# Patient Record
Sex: Male | Born: 1995 | Race: White | Hispanic: No | Marital: Single | Smoking: Current every day smoker
Health system: Southern US, Community
[De-identification: ages and names within clinical notes are randomized; demographics above are authoritative.]

## PROBLEM LIST (undated history)

## (undated) DIAGNOSIS — F909 Attention-deficit hyperactivity disorder, unspecified type: Secondary | ICD-10-CM

## (undated) DIAGNOSIS — F419 Anxiety disorder, unspecified: Secondary | ICD-10-CM

## (undated) DIAGNOSIS — K4090 Unilateral inguinal hernia, without obstruction or gangrene, not specified as recurrent: Secondary | ICD-10-CM

## (undated) DIAGNOSIS — F32A Depression, unspecified: Secondary | ICD-10-CM

## (undated) DIAGNOSIS — F319 Bipolar disorder, unspecified: Secondary | ICD-10-CM

## (undated) DIAGNOSIS — F329 Major depressive disorder, single episode, unspecified: Secondary | ICD-10-CM

## (undated) HISTORY — DX: Major depressive disorder, single episode, unspecified: F32.9

## (undated) HISTORY — DX: Anxiety disorder, unspecified: F41.9

## (undated) HISTORY — DX: Depression, unspecified: F32.A

## (undated) HISTORY — DX: Attention-deficit hyperactivity disorder, unspecified type: F90.9

## (undated) HISTORY — DX: Bipolar disorder, unspecified: F31.9

---
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
## 2017-01-22 DIAGNOSIS — R569 Unspecified convulsions: Secondary | ICD-10-CM | POA: Insufficient documentation

## 2017-01-31 DIAGNOSIS — R569 Unspecified convulsions: Secondary | ICD-10-CM | POA: Insufficient documentation

## 2017-03-08 ENCOUNTER — Other Ambulatory Visit: Payer: Self-pay

## 2017-03-08 ENCOUNTER — Ambulatory Visit: Payer: 59 | Admitting: Psychiatry

## 2017-03-08 ENCOUNTER — Encounter: Payer: Self-pay | Admitting: Psychiatry

## 2017-03-08 VITALS — BP 111/69 | HR 60 | Temp 98.3°F | Wt 126.8 lb

## 2017-03-08 DIAGNOSIS — F1221 Cannabis dependence, in remission: Secondary | ICD-10-CM

## 2017-03-08 DIAGNOSIS — F3170 Bipolar disorder, currently in remission, most recent episode unspecified: Secondary | ICD-10-CM

## 2017-03-08 DIAGNOSIS — F1421 Cocaine dependence, in remission: Secondary | ICD-10-CM | POA: Diagnosis not present

## 2017-03-08 NOTE — Progress Notes (Signed)
Psychiatric progress note  Patient Identification: Charles GauzeShawn Austin Burke MRN:  [id number] Date of Evaluation:  03/08/2017 Referral Source: Dr.Puchilowska Chief Complaint:  Increased depression and anxiety Chief Complaint    Follow-up; Medication Refill     Visit Diagnosis:    ICD-10-CM   1. Bipolar disorder in partial remission, most recent episode unspecified type (HCC) F31.70   2. Cocaine dependence in early, early partial, sustained full, or sustained partial remission (HCC) F14.21   3. Cannabis dependence in early, early partial, sustained full, or sustained partial remission (HCC) F12.21     History of Present Illness:  Patient patient is a 22 year old Caucasian male who presents today for follow up Of bipolar disorder, cocaine dependence in early remission, cannabis dependence in early remission. Patient reports today that he has been feeling more depressed and anxious lately. States that he is quite stressed at work. 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  Reports fair sleep and appetite. Denies use of any marijuana or cocaine since last visit. Overall he has been quite down sober per patient.  States that he has not had any more seizure like episodes. Continues to stay with his grandmother. Compliant with his medications.   Past Psychiatric History: Most recently admitted to inpatient psychiatric services  Previous Psychotropic Medications: No   Substance Abuse History in the last 12 months:  Yes.    Consequences of Substance Abuse:   Past Medical History:  Past Medical History:  Diagnosis Date  . ADHD (attention deficit hyperactivity disorder)   . Anxiety   . Bipolar disorder (HCC)   . Depression    History reviewed. No pertinent surgical history.  Family Psychiatric History: see below  Family History:  Family History  Problem Relation Age of Onset  . Anxiety disorder Mother   . Depression Mother     Social History:   Social  History   Socioeconomic History  . Marital status: Single    Spouse name: None  . Number of children: None  . Years of education: None  . Highest education level: None  Social Needs  . Financial resource strain: None  . Food insecurity - worry: None  . Food insecurity - inability: None  . Transportation needs - medical: None  . Transportation needs - non-medical: None  Occupational History  . None  Tobacco Use  . Smoking status: Current Some Day Smoker    Packs/day: 1.00    Types: Cigarettes  . Smokeless tobacco: Never Used  Substance and Sexual Activity  . Alcohol use: No  . Drug use: Yes    Types: Cocaine, Marijuana    Comment: last used 2 weeks ago  . Sexual activity: Yes    Partners: Female    Birth control/protection: Condom  Other Topics Concern  . None  Social History Narrative  . None    Additional Social History:   Allergies:   Allergies  Allergen Reactions  . Bee Venom     Metabolic Disorder Labs: Lab Results  Component Value Date   HGBA1C 5.6 08/16/2016   MPG 114 08/16/2016   No results found for: PROLACTIN Lab Results  Component Value Date   CHOL 129 08/16/2016   TRIG 63 08/16/2016   HDL 49 08/16/2016   CHOLHDL 2.6 08/16/2016   VLDL 13 08/16/2016   LDLCALC 67 08/16/2016     Current Medications: Current Outpatient Medications  Medication Sig Dispense Refill  . ARIPiprazole (ABILIFY) 5 MG tablet Take 1 tablet (5  mg total) by mouth daily. 90 tablet 0  . Fluticasone-Salmeterol (ADVAIR DISKUS) 100-50 MCG/DOSE AEPB Inhale into the lungs.    . fluvoxaMINE (LUVOX) 100 MG tablet Take 1 tablet (100 mg total) by mouth at bedtime. 90 tablet 0  . traZODone (DESYREL) 100 MG tablet Take 1 tablet (100 mg total) by mouth at bedtime as needed for sleep. 30 tablet 1   No current facility-administered medications for this visit.     Neurologic: Headache: No Seizure: No Paresthesias:No  Musculoskeletal: Strength & Muscle Tone: within normal  limits Gait & Station: normal Patient leans: N/A  Psychiatric Specialty Exam: ROS  Blood pressure 111/69, pulse 60, temperature 98.3 F (36.8 C), temperature source Oral, weight 57.5 kg (126 lb 12.8 oz).Body mass index is 20.47 kg/m.  General Appearance: Casual  Eye Contact:  Fair  Speech:  Clear and Coherent  Volume:  Normal  Mood:  depressed  Affect:  Appropriate  Thought Process:  Coherent  Orientation:  Full (Time, Place, and Person)  Thought Content:  Logical  Suicidal Thoughts:  No  Homicidal Thoughts:  No  Memory:  Immediate;   Fair Recent;   Fair Remote;   Fair  Judgement:  Fair  Insight:  Fair  Psychomotor Activity:  Normal  Concentration:  Concentration: Fair and Attention Span: Fair  Recall:  [employer] of Knowledge:Fair  Language: Fair  Akathisia:  No  Handed:  Right  AIMS (if indicated):  none  Assets:  Communication Skills Desire for Improvement Financial Resources/Insurance Housing Physical Health Resilience Social Support Vocational/Educational  ADL's:  Intact  Cognition: WNL  Sleep:  ok    Treatment Plan Summary:  Bipolar 2 disorder currently in early remission Increase Abilify to 10 mg to take by mouth once daily.  Generalized anxiety disorder Continue Luvox at 100 mg daily  Cocaine dependence in very early remission  Cannabis dependence in early remission Same as above  Insomnia Resolved   Return to clinic in 1 months time or call before if needed     [name], MD 2/28/201910:39 AM

## 2017-03-15 DIAGNOSIS — R4689 Other symptoms and signs involving appearance and behavior: Secondary | ICD-10-CM | POA: Insufficient documentation

## 2017-03-21 ENCOUNTER — Telehealth: Payer: Self-pay

## 2017-03-21 NOTE — Telephone Encounter (Signed)
pt mother called states pt was seen on 03-08-17 and that his medication was not sent [facility].  pt mom wanted to know if a 30 day supply could be sent to [facility] [address] and a 90 day supply sent to [facility].

## 2017-03-22 ENCOUNTER — Other Ambulatory Visit: Payer: Self-pay | Admitting: Psychiatry

## 2017-03-22 MED ORDER — FLUVOXAMINE MALEATE 100 MG PO TABS: 100.0000 mg | ORAL_TABLET | Freq: Every day | ORAL | 0 refills | Status: DC

## 2017-03-22 MED ORDER — ARIPIPRAZOLE 5 MG PO TABS: 5.0000 mg | ORAL_TABLET | Freq: Every day | ORAL | 0 refills | Status: DC

## 2017-03-22 MED ORDER — TRAZODONE HCL 100 MG PO TABS: 100.0000 mg | ORAL_TABLET | Freq: Every evening | ORAL | 1 refills | Status: DC | PRN

## 2017-03-22 NOTE — Telephone Encounter (Signed)
faxed and confirmed rx for [facility]  abilify 5mg  id [id number] order # [id number]  , trazodone 100mg  id [id number] order # [id number] , and fluvoxamine 100mg  id [id number] order # [id number]

## 2017-03-22 NOTE — Telephone Encounter (Signed)
[facility]

## 2017-04-05 ENCOUNTER — Ambulatory Visit: Payer: 59 | Admitting: Psychiatry

## 2017-04-05 ENCOUNTER — Other Ambulatory Visit: Payer: Self-pay

## 2017-04-05 ENCOUNTER — Encounter: Payer: Self-pay | Admitting: Psychiatry

## 2017-04-05 VITALS — BP 121/72 | HR 65 | Temp 97.5°F | Wt 128.8 lb

## 2017-04-05 DIAGNOSIS — F1221 Cannabis dependence, in remission: Secondary | ICD-10-CM | POA: Diagnosis not present

## 2017-04-05 DIAGNOSIS — F3170 Bipolar disorder, currently in remission, most recent episode unspecified: Secondary | ICD-10-CM

## 2017-04-05 DIAGNOSIS — F1421 Cocaine dependence, in remission: Secondary | ICD-10-CM

## 2017-04-05 MED ORDER — ARIPIPRAZOLE 10 MG PO TABS: 10.0000 mg | ORAL_TABLET | Freq: Every day | ORAL | 1 refills | Status: DC

## 2017-04-05 NOTE — Progress Notes (Signed)
Psychiatric progress note  Patient Identification: Charles Burke MRN:  [id number] Date of Evaluation:  04/05/2017 Referral Source: Dr.Puchilowska Chief Complaint:  Feeling the same Chief Complaint    Follow-up; Medication Refill     Visit Diagnosis:    ICD-10-CM   1. Bipolar disorder in partial remission, most recent episode unspecified type (HCC) F31.70   2. Cocaine dependence in early, early partial, sustained full, or sustained partial remission (HCC) F14.21   3. Cannabis dependence in early, early partial, sustained full, or sustained partial remission (HCC) F12.21     History of Present Illness:  Patient patient is a 22 year old Caucasian male who presents today for follow up Of bipolar disorder, cocaine dependence in early remission, cannabis dependence in early remission. Patient reports today that he has been feeling he same.  Patient not as talkative today. States that he stopped his job 2 days ago but would not say more about it. States that his sleep and appetite are okay. Denies using any substances. Patient appears irritable and on edge. Continues to stay with his grandmother. Compliant with his medications. Denies any suicidal thoughts.   Past Psychiatric History: Most recently admitted to inpatient psychiatric services  Previous Psychotropic Medications: No   Substance Abuse History in the last 12 months:  Yes.    Consequences of Substance Abuse:   Past Medical History:  Past Medical History:  Diagnosis Date  . ADHD (attention deficit hyperactivity disorder)   . Anxiety   . Bipolar disorder (HCC)   . Depression    History reviewed. No pertinent surgical history.  Family Psychiatric History: see below  Family History:  Family History  Problem Relation Age of Onset  . Anxiety disorder Mother   . Depression Mother     Social History:   Social History   Socioeconomic History  . Marital status: Single    Spouse name: Not on file  . Number of  children: Not on file  . Years of education: Not on file  . Highest education level: Not on file  Occupational History  . Not on file  Social Needs  . Financial resource strain: Not on file  . Food insecurity:    Worry: Not on file    Inability: Not on file  . Transportation needs:    Medical: Not on file    Non-medical: Not on file  Tobacco Use  . Smoking status: Current Some Day Smoker    Packs/day: 1.00    Types: Cigarettes  . Smokeless tobacco: Never Used  Substance and Sexual Activity  . Alcohol use: No  . Drug use: Yes    Types: Cocaine, Marijuana    Comment: last used 2 weeks ago  . Sexual activity: Yes    Partners: Female    Birth control/protection: Condom  Lifestyle  . Physical activity:    Days per week: Not on file    Minutes per session: Not on file  . Stress: Not on file  Relationships  . Social connections:    Talks on phone: Not on file    Gets together: Not on file    Attends religious service: Not on file    Active member of club or organization: Not on file    Attends meetings of clubs or organizations: Not on file    Relationship status: Not on file  Other Topics Concern  . Not on file  Social History Narrative  . Not on file    Additional Social History:   Allergies:  Allergies  Allergen Reactions  . Bee Venom     Metabolic Disorder Labs: Lab Results  Component Value Date   HGBA1C 5.6 08/16/2016   MPG 114 08/16/2016   No results found for: PROLACTIN Lab Results  Component Value Date   CHOL 129 08/16/2016   TRIG 63 08/16/2016   HDL 49 08/16/2016   CHOLHDL 2.6 08/16/2016   VLDL 13 08/16/2016   LDLCALC 67 08/16/2016     Current Medications: Current Outpatient Medications  Medication Sig Dispense Refill  . ARIPiprazole (ABILIFY) 5 MG tablet Take 1 tablet (5 mg total) by mouth daily. 90 tablet 0  . Fluticasone-Salmeterol (ADVAIR DISKUS) 100-50 MCG/DOSE AEPB Inhale into the lungs.    . fluvoxaMINE (LUVOX) 100 MG tablet Take 1  tablet (100 mg total) by mouth at bedtime. 90 tablet 0  . traZODone (DESYREL) 100 MG tablet Take 1 tablet (100 mg total) by mouth at bedtime as needed for sleep. 30 tablet 1   No current facility-administered medications for this visit.     Neurologic: Headache: No Seizure: No Paresthesias:No  Musculoskeletal: Strength & Muscle Tone: within normal limits Gait & Station: normal Patient leans: N/A  Psychiatric Specialty Exam: ROS  Blood pressure 121/72, pulse 65, temperature (!) 97.5 F (36.4 C), temperature source Oral, weight 58.4 kg (128 lb 12.8 oz).Body mass index is 20.79 kg/m.  General Appearance: Casual  Eye Contact:  Fair  Speech:  Clear and Coherent  Volume:  Normal  Mood:  depressed  Affect:   irritable  Thought Process:  Coherent  Orientation:  Full (Time, Place, and Person)  Thought Content:  Logical  Suicidal Thoughts:  No  Homicidal Thoughts:  No  Memory:  Immediate;   Fair Recent;   Fair Remote;   Fair  Judgement:  Fair  Insight:  Fair  Psychomotor Activity:  Normal  Concentration:  Concentration: Fair and Attention Span: Fair  Recall:  [employer] of Knowledge:Fair  Language: Fair  Akathisia:  No  Handed:  Right  AIMS (if indicated):  none  Assets:  Communication Skills Desire for Improvement Financial Resources/Insurance Housing Physical Health Resilience Social Support Vocational/Educational  ADL's:  Intact  Cognition: WNL  Sleep:  ok    Treatment Plan Summary:  Bipolar 2 disorder currently in early remission Continue Abilify to 10 mg to take by mouth once daily.  Generalized anxiety disorder Continue Luvox at 100 mg daily  Cocaine dependence in very early remission  Cannabis dependence in early remission Same as above Obtain urine toxicology and blood toxicology analysis prior to next visit  Recommend that patient see a therapist but he is not interested at this time.  Insomnia Resolved   Return to clinic in 1 months time or  call before if needed      [name], MD 3/28/201910:38 AM

## 2017-05-03 ENCOUNTER — Ambulatory Visit: Payer: 59 | Admitting: Psychiatry

## 2017-05-29 ENCOUNTER — Other Ambulatory Visit: Payer: Self-pay

## 2017-05-29 ENCOUNTER — Encounter: Payer: Self-pay | Admitting: Psychiatry

## 2017-05-29 ENCOUNTER — Ambulatory Visit: Payer: 59 | Admitting: Psychiatry

## 2017-05-29 VITALS — BP 120/71 | HR 81 | Temp 97.8°F | Wt 128.4 lb

## 2017-05-29 DIAGNOSIS — F1421 Cocaine dependence, in remission: Secondary | ICD-10-CM

## 2017-05-29 DIAGNOSIS — F3170 Bipolar disorder, currently in remission, most recent episode unspecified: Secondary | ICD-10-CM | POA: Diagnosis not present

## 2017-05-29 DIAGNOSIS — F1221 Cannabis dependence, in remission: Secondary | ICD-10-CM | POA: Diagnosis not present

## 2017-05-29 MED ORDER — FLUVOXAMINE MALEATE 100 MG PO TABS: 100.0000 mg | ORAL_TABLET | Freq: Every day | ORAL | 2 refills | Status: DC

## 2017-05-29 MED ORDER — ARIPIPRAZOLE 10 MG PO TABS: 10.0000 mg | ORAL_TABLET | Freq: Every day | ORAL | 2 refills | Status: DC

## 2017-05-29 NOTE — Progress Notes (Signed)
Psychiatric progress note  Patient Identification: Charles Burke MRN:  [id number] Date of Evaluation:  05/29/2017 Referral Source: Dr.Puchilowska Chief Complaint:  Feeling the same Chief Complaint    Follow-up; Medication Refill     Visit Diagnosis:    ICD-10-CM   1. Bipolar disorder in partial remission, most recent episode unspecified type (HCC) F31.70   2. Cocaine dependence in early, early partial, sustained full, or sustained partial remission (HCC) F14.21   3. Cannabis dependence in early, early partial, sustained full, or sustained partial remission (HCC) F12.21     History of Present Illness:  Patient patient is a 22 year old Caucasian male who presents today for follow up Of bipolar disorder, cocaine dependence in early remission, cannabis dependence in early remission. Patient denies any issues. He appears somewhat sleepy and slow talking but denies using any substances. States he lost his lab slip from last time and did not obtain his urine or blood toxicology labs. Not working currently. States he is moving to [place] for the summer. Denies any suicidal thoughts.   Past Psychiatric History: Most recently admitted to inpatient psychiatric services  Previous Psychotropic Medications: No   Substance Abuse History in the last 12 months:  Yes.    Consequences of Substance Abuse:   Past Medical History:  Past Medical History:  Diagnosis Date  . ADHD (attention deficit hyperactivity disorder)   . Anxiety   . Bipolar disorder (HCC)   . Depression    History reviewed. No pertinent surgical history.  Family Psychiatric History: see below  Family History:  Family History  Problem Relation Age of Onset  . Anxiety disorder Mother   . Depression Mother     Social History:   Social History   Socioeconomic History  . Marital status: Single    Spouse name: Not on file  . Number of children: Not on file  . Years of education: Not on file  . Highest education  level: Not on file  Occupational History  . Not on file  Social Needs  . Financial resource strain: Not on file  . Food insecurity:    Worry: Not on file    Inability: Not on file  . Transportation needs:    Medical: Not on file    Non-medical: Not on file  Tobacco Use  . Smoking status: Current Some Day Smoker    Packs/day: 1.00    Types: Cigarettes  . Smokeless tobacco: Never Used  Substance and Sexual Activity  . Alcohol use: No  . Drug use: Yes    Types: Cocaine, Marijuana    Comment: last used 2 weeks ago  . Sexual activity: Yes    Partners: Female    Birth control/protection: Condom  Lifestyle  . Physical activity:    Days per week: Not on file    Minutes per session: Not on file  . Stress: Not on file  Relationships  . Social connections:    Talks on phone: Not on file    Gets together: Not on file    Attends religious service: Not on file    Active member of club or organization: Not on file    Attends meetings of clubs or organizations: Not on file    Relationship status: Not on file  Other Topics Concern  . Not on file  Social History Narrative  . Not on file    Additional Social History:   Allergies:   Allergies  Allergen Reactions  . Bee Venom  Metabolic Disorder Labs: Lab Results  Component Value Date   HGBA1C 5.6 08/16/2016   MPG 114 08/16/2016   No results found for: PROLACTIN Lab Results  Component Value Date   CHOL 129 08/16/2016   TRIG 63 08/16/2016   HDL 49 08/16/2016   CHOLHDL 2.6 08/16/2016   VLDL 13 08/16/2016   LDLCALC 67 08/16/2016     Current Medications: Current Outpatient Medications  Medication Sig Dispense Refill  . ARIPiprazole (ABILIFY) 10 MG tablet Take 1 tablet (10 mg total) by mouth daily. 30 tablet 1  . Fluticasone-Salmeterol (ADVAIR DISKUS) 100-50 MCG/DOSE AEPB Inhale into the lungs.    . fluvoxaMINE (LUVOX) 100 MG tablet Take 1 tablet (100 mg total) by mouth at bedtime. 90 tablet 0  . traZODone  (DESYREL) 100 MG tablet Take 1 tablet (100 mg total) by mouth at bedtime as needed for sleep. 30 tablet 1   No current facility-administered medications for this visit.     Neurologic: Headache: No Seizure: No Paresthesias:No  Musculoskeletal: Strength & Muscle Tone: within normal limits Gait & Station: normal Patient leans: N/A  Psychiatric Specialty Exam: ROS  Blood pressure 120/71, pulse 81, temperature 97.8 F (36.6 C), temperature source Oral, weight 58.2 kg (128 lb 6.4 oz).Body mass index is 20.72 kg/m.  General Appearance: Casual  Eye Contact:  Fair  Speech:  Clear and Coherent, slow  Volume:  Normal  Mood: better  Affect:  Sleepy,   Thought Process:  Coherent  Orientation:  Full (Time, Place, and Person)  Thought Content:  Logical  Suicidal Thoughts:  No  Homicidal Thoughts:  No  Memory:  Immediate;   Fair Recent;   Fair Remote;   Fair  Judgement:  Fair  Insight:  Fair  Psychomotor Activity:  Normal  Concentration:  Concentration: Fair and Attention Span: Fair  Recall:  [employer] of Knowledge:Fair  Language: Fair  Akathisia:  No  Handed:  Right  AIMS (if indicated):  none  Assets:  Communication Skills Desire for Improvement Financial Resources/Insurance Housing Physical Health Resilience Social Support Vocational/Educational  ADL's:  Intact  Cognition: WNL  Sleep:  ok    Treatment Plan Summary:  Bipolar 2 disorder currently in early remission Continue Abilify to 10 mg to take by mouth once daily.  Generalized anxiety disorder Continue Luvox at 100 mg daily  Cocaine dependence in very early remission  Cannabis dependence in early remission Same as above Obtain urine toxicology and blood toxicology analysis prior to next visit, lab slip given.  Discussed following up with the substance abuse program in [place], patient not interested.   Insomnia Resolved   Return to clinic in 3 months time or call before if  needed      [name], MD 5/21/201910:38 AM

## 2017-06-11 ENCOUNTER — Telehealth: Payer: Self-pay

## 2017-06-11 NOTE — Telephone Encounter (Signed)
mother called back states that they need a 90 day supply instead of 30 day supply.

## 2017-06-11 NOTE — Telephone Encounter (Signed)
pt lost his rx that you gave him for his medication .  mother called and asked if the rx can just be sent to [facility] instead of printed out.

## 2017-06-12 ENCOUNTER — Other Ambulatory Visit: Payer: Self-pay | Admitting: Psychiatry

## 2017-06-12 ENCOUNTER — Encounter: Payer: Self-pay | Admitting: Psychiatry

## 2017-06-12 MED ORDER — ARIPIPRAZOLE 10 MG PO TABS
10.0000 mg | ORAL_TABLET | Freq: Every day | ORAL | 1 refills | Status: AC
Start: 2017-06-12 — End: ?

## 2017-06-12 MED ORDER — FLUVOXAMINE MALEATE 100 MG PO TABS: 100.0000 mg | ORAL_TABLET | Freq: Every day | ORAL | 1 refills | Status: AC

## 2017-06-12 MED ORDER — TRAZODONE HCL 100 MG PO TABS: 100.0000 mg | ORAL_TABLET | Freq: Every evening | ORAL | 1 refills | Status: AC | PRN

## 2017-06-12 NOTE — Telephone Encounter (Signed)
rx for abilify , luvox, desyrel was faxed and confirmed  to [facility]. mother notified that rx were sent.

## 2017-06-12 NOTE — Telephone Encounter (Signed)
Okay. prescriptions printed

## 2017-12-08 ENCOUNTER — Emergency Department
Admission: EM | Admit: 2017-12-08 | Discharge: 2017-12-09 | Disposition: A | Discharge status: Home / Self Care | Payer: Managed Care, Other (non HMO) | Attending: Emergency Medicine | Admitting: Emergency Medicine

## 2017-12-08 ENCOUNTER — Encounter: Payer: Self-pay | Admitting: Emergency Medicine

## 2017-12-08 ENCOUNTER — Other Ambulatory Visit: Payer: Self-pay

## 2017-12-08 DIAGNOSIS — F1210 Cannabis abuse, uncomplicated: Secondary | ICD-10-CM | POA: Insufficient documentation

## 2017-12-08 DIAGNOSIS — F39 Unspecified mood [affective] disorder: Secondary | ICD-10-CM

## 2017-12-08 DIAGNOSIS — F17210 Nicotine dependence, cigarettes, uncomplicated: Secondary | ICD-10-CM | POA: Insufficient documentation

## 2017-12-08 DIAGNOSIS — R4689 Other symptoms and signs involving appearance and behavior: Secondary | ICD-10-CM | POA: Diagnosis present

## 2017-12-08 DIAGNOSIS — J45909 Unspecified asthma, uncomplicated: Secondary | ICD-10-CM | POA: Diagnosis not present

## 2017-12-08 DIAGNOSIS — F319 Bipolar disorder, unspecified: Secondary | ICD-10-CM

## 2017-12-08 DIAGNOSIS — F1410 Cocaine abuse, uncomplicated: Secondary | ICD-10-CM | POA: Diagnosis not present

## 2017-12-08 DIAGNOSIS — F909 Attention-deficit hyperactivity disorder, unspecified type: Secondary | ICD-10-CM | POA: Insufficient documentation

## 2017-12-08 DIAGNOSIS — Z79899 Other long term (current) drug therapy: Secondary | ICD-10-CM | POA: Insufficient documentation

## 2017-12-08 MED ORDER — ZIPRASIDONE MESYLATE 20 MG IM SOLR
10.0000 mg | Freq: Once | INTRAMUSCULAR | Status: DC
  Filled 2017-12-08: qty 20

## 2017-12-08 NOTE — ED Provider Notes (Signed)
[facility] Emergency Department Provider Note   ____________________________________________   First MD Initiated Contact with Patient 12/08/17 2356     (approximate)  I have reviewed the triage vital signs and the nursing notes.   HISTORY  Chief Complaint Aggressive Behavior  Level V caveat: Limited by aggressive behavior  HPI Charles Burke is a 22 y.o. male brought to the ED under IVC for aggression and SI.  Patient arrives combative, refusing blood draw or dressing out.  Unable to obtain rest of interview and history secondary to combative behavior.   Past Medical History:  Diagnosis Date  . ADHD (attention deficit hyperactivity disorder)   . Anxiety   . Bipolar disorder (HCC)   . Depression     Patient Active Problem List   Diagnosis Date Noted  . Spell of abnormal behavior 03/15/2017  . Seizure-like activity (HCC) 01/31/2017  . Seizure (HCC) 01/22/2017  . Asthma 08/16/2016  . Bipolar I disorder, current or most recent episode depressed, with psychotic features (HCC) 08/15/2016  . Suicidal ideation 08/15/2016  . Cocaine use disorder, moderate, dependence (HCC) 08/15/2016  . Cannabis use disorder, moderate, dependence (HCC) 08/15/2016    History reviewed. No pertinent surgical history.  Prior to Admission medications   Medication Sig Start Date End Date Taking? Authorizing Provider  ARIPiprazole (ABILIFY) 10 MG tablet Take 1 tablet (10 mg total) by mouth daily. 06/12/17   [name], MD  Fluticasone-Salmeterol (ADVAIR DISKUS) 100-50 MCG/DOSE AEPB Inhale into the lungs.    [provider]  fluvoxaMINE (LUVOX) 100 MG tablet Take 1 tablet (100 mg total) by mouth at bedtime. 06/12/17   [name], MD  traZODone (DESYREL) 100 MG tablet Take 1 tablet (100 mg total) by mouth at bedtime as needed for sleep. 06/12/17   [name], MD    Allergies Bee venom  Family History  Problem Relation Age of Onset  . Anxiety  disorder Mother   . Depression Mother     Social History Social History   Tobacco Use  . Smoking status: Current Some Day Smoker    Packs/day: 1.00    Types: Cigarettes  . Smokeless tobacco: Never Used  Substance Use Topics  . Alcohol use: No  . Drug use: Yes    Types: Cocaine, Marijuana    Comment: last used 2 weeks ago    Review of Systems  Constitutional: No fever/chills Eyes: No visual changes. ENT: No sore throat. Cardiovascular: Denies chest pain. Respiratory: Denies shortness of breath. Gastrointestinal: No abdominal pain.  No nausea, no vomiting.  No diarrhea.  No constipation. Genitourinary: Negative for dysuria. Musculoskeletal: Negative for back pain. Skin: Negative for rash. Neurological: Negative for headaches, focal weakness or numbness. Psychiatric:Positive for SI.  ____________________________________________   PHYSICAL EXAM:  VITAL SIGNS: ED Triage Vitals  Enc Vitals Group     BP 12/08/17 2231 97/67     Pulse Rate 12/08/17 2231 68     Resp 12/08/17 2231 18     Temp 12/08/17 2231 98.1 F (36.7 C)     Temp Source 12/08/17 2231 Oral     SpO2 12/08/17 2231 97 %     Weight 12/08/17 2232 125 lb (56.7 kg)     Height 12/08/17 2232 5\' 5"  (1.651 m)     Head Circumference --      Peak Flow --      Pain Score 12/08/17 2240 0     Pain Loc --      Pain Edu? --  Excl. in GC? --     Constitutional: Alert and oriented.  Disheveled appearing and in mild acute distress. Eyes: Conjunctivae are normal. PERRL. EOMI. Head: Atraumatic. Nose: No congestion/rhinnorhea. Mouth/Throat: Mucous membranes are moist.  Oropharynx non-erythematous. Neck: No stridor.   Cardiovascular: Normal rate, regular rhythm. Grossly normal heart sounds.  Good peripheral circulation. Respiratory: Normal respiratory effort.  No retractions. Lungs CTAB. Gastrointestinal: Soft and nontender. No distention. No abdominal bruits. No CVA tenderness. Musculoskeletal: No lower  extremity tenderness nor edema.  No joint effusions. Neurologic:  Normal speech and language. No gross focal neurologic deficits are appreciated. No gait instability. Skin:  Skin is warm, dry and intact. No rash noted. Psychiatric: Mood and affect are combative. Speech and behavior are normal.  ____________________________________________   LABS (all labs ordered are listed, but only abnormal results are displayed)  Labs Reviewed  ACETAMINOPHEN LEVEL - Abnormal; Notable for the following components:      Result Value   Acetaminophen (Tylenol), Serum <10 (*)    All other components within normal limits  CBC WITH DIFFERENTIAL/PLATELET  COMPREHENSIVE METABOLIC PANEL  ETHANOL  SALICYLATE LEVEL  URINE DRUG SCREEN, QUALITATIVE ([facility] ONLY)   ____________________________________________  EKG  None ____________________________________________  RADIOLOGY  ED MD interpretation: None  Official radiology report(s): No results found.  ____________________________________________   PROCEDURES  Procedure(s) performed: None  Procedures  Critical Care performed: No  ____________________________________________   INITIAL IMPRESSION / ASSESSMENT AND PLAN / ED COURSE  As part of my medical decision making, I reviewed the following data within the electronic MEDICAL RECORD NUMBER Nursing notes reviewed and incorporated, Labs reviewed, Old chart reviewed, A consult was requested and obtained from this/these consultant(s) Psychiatry and Notes from prior ED visits   22 year old male with bipolar disorder brought in under IVC for suicidal ideation.  Patient is combative upon arrival to the treatment room.  He was ultimately able to be verbally redirected by the nurse and did not require IM calming agent for now.  Offered Ativan for calming but patient declines.  Clinical Course as of Dec 09 725  Wynelle LinkSun Dec 09, 2017  [id number] [facility] Waldron SessionGerz who has rescinded  his IVC.  Does not meet criteria for IVC or inpatient admission.  Recommends referral for outpatient treatment.  Strict return precautions given.  Patient verbalizes understanding and agrees with plan of care.   [JS]    Clinical Course User Index [JS] Irean HongSung, Clairissa Valvano J, MD     ____________________________________________   FINAL CLINICAL IMPRESSION(S) / ED DIAGNOSES  Final diagnoses:  Bipolar 1 disorder (HCC)  Mood disorder [facility](HCC)     ED Discharge Orders    None       Note:  This document was prepared using Dragon voice recognition software and may include unintentional dictation errors.    Irean HongSung, Oliver Neuwirth J, MD 12/09/17 [phone]

## 2017-12-08 NOTE — ED Triage Notes (Signed)
Pt arrives with [place] [employer] IVC due to being aggressive at home and making SI statements. Pt refuses to have his blood drawn at this time or give urine of dress out. Pt stating that "Grandma's a bitch", and that this RN does not "understand, I'm telling you, you ain't getting my blood".

## 2017-12-08 NOTE — ED Notes (Signed)
At this time pt is refusing to let this writer get blood work

## 2017-12-09 LAB — CBC WITH DIFFERENTIAL/PLATELET
ABS IMMATURE GRANULOCYTES: 0.06 10*3/uL (ref 0.00–0.07)
Basophils Absolute: 0 10*3/uL (ref 0.0–0.1)
Basophils Relative: 0 %
Eosinophils Absolute: 0.2 10*3/uL (ref 0.0–0.5)
Eosinophils Relative: 2 %
HCT: 44.7 % (ref 39.0–52.0)
HEMOGLOBIN: 15.1 g/dL (ref 13.0–17.0)
IMMATURE GRANULOCYTES: 1 %
LYMPHS PCT: 35 %
Lymphs Abs: 3.2 10*3/uL (ref 0.7–4.0)
MCH: 30.3 pg (ref 26.0–34.0)
MCHC: 33.8 g/dL (ref 30.0–36.0)
MCV: 89.6 fL (ref 80.0–100.0)
MONO ABS: 0.7 10*3/uL (ref 0.1–1.0)
MONOS PCT: 7 %
NEUTROS ABS: 5 10*3/uL (ref 1.7–7.7)
NEUTROS PCT: 55 %
Platelets: 228 10*3/uL (ref 150–400)
RBC: 4.99 MIL/uL (ref 4.22–5.81)
RDW: 12 % (ref 11.5–15.5)
WBC: 9.1 10*3/uL (ref 4.0–10.5)
nRBC: 0 % (ref 0.0–0.2)

## 2017-12-09 LAB — COMPREHENSIVE METABOLIC PANEL
ALK PHOS: 38 U/L (ref 38–126)
ALT: 17 U/L (ref 0–44)
AST: 21 U/L (ref 15–41)
Albumin: 4.2 g/dL (ref 3.5–5.0)
Anion gap: 7 (ref 5–15)
BUN: 15 mg/dL (ref 6–20)
CALCIUM: 9.1 mg/dL (ref 8.9–10.3)
CHLORIDE: 103 mmol/L (ref 98–111)
CO2: 30 mmol/L (ref 22–32)
CREATININE: 0.92 mg/dL (ref 0.61–1.24)
GFR calc non Af Amer: >60 mL/min (ref >60)
Glucose, Bld: 95 mg/dL (ref 70–99)
Potassium: 3.9 mmol/L (ref 3.5–5.1)
SODIUM: 140 mmol/L (ref 135–145)
Total Bilirubin: 0.7 mg/dL (ref 0.3–1.2)
Total Protein: 7 g/dL (ref 6.5–8.1)

## 2017-12-09 LAB — ACETAMINOPHEN LEVEL

## 2017-12-09 LAB — ETHANOL: Alcohol, Ethyl (B): <10 mg/dL (ref <10)

## 2017-12-09 LAB — SALICYLATE LEVEL: Salicylate Lvl: <7 mg/dL (ref 2.8–30.0)

## 2017-12-09 MED ORDER — NICOTINE 21 MG/24HR TD PT24
21.0000 mg | MEDICATED_PATCH | Freq: Once | TRANSDERMAL | Status: DC
  Administered 2017-12-09: 21 mg via TRANSDERMAL
  Filled 2017-12-09: qty 1

## 2017-12-09 NOTE — ED Notes (Signed)
Pt. Changed in room #20.  Pt. Has a pair of brown boots, pair of olive jean pants, plaid blue boxers, yellow T-shirt, grey pullover sweatshirt and brown long underwear.  Blood specimen also collected in room.

## 2017-12-09 NOTE — BH Assessment (Signed)
Assessment Note  Charles Burke is an 22 y.o. male. Who presents following involuntary commitment accompanied by the police department. Patient is well oriented and explained that he has a history of bipolar disorder. He reports one previous admission here at [facility] after which he was placed on psychiatric medication. He reports compliance up until approximately 7 months ago, when he ran out of medication and failed to schedule a follow-up medication management appointment. Patient denies any suicidal thoughts, plans, or intent to harm himself. He reports the one previous suicide attempt. Patient states that on today he began to have an argument with his grandmother, whom he lived with. He states "she didn't like me cursing at her so she called the police." Patient denies the use of any mood-altering substances. Patient is not manic, hyperverbal, aggressive when engaging with this write. His speech and thought process are liner, although his affect was flat. He appears to exhibit average insight and impulse control. Pt.has contracted for safety. Pt. denies the presence of any auditory or visual hallucinations at this time. Patient denies any other medical complaints.   Diagnosis: Bipolar Disorder   Past Medical History:  Past Medical History:  Diagnosis Date  . ADHD (attention deficit hyperactivity disorder)   . Anxiety   . Bipolar disorder (HCC)   . Depression     History reviewed. No pertinent surgical history.  Family History:  Family History  Problem Relation Age of Onset  . Anxiety disorder Mother   . Depression Mother     Social History:  reports that he has been smoking cigarettes. He has been smoking about 1.00 pack per day. He has never used smokeless tobacco. He reports that he has current or past drug history. Drugs: Cocaine and Marijuana. He reports that he does not drink alcohol.  Additional Social History:  Alcohol / Drug Use Pain Medications: Pt denies  abuse. Prescriptions: Pt denies abuse. Over the Counter: Pt denies abuse. History of alcohol / drug use?: No history of alcohol / drug abuse  CIWA: CIWA-Ar BP: 97/67 Pulse Rate: 68 COWS:    Allergies:  Allergies  Allergen Reactions  . Bee Venom     Home Medications:  (Not in a hospital admission)  OB/GYN Status:  No LMP for male patient.  General Assessment Data Location of Assessment: [facility] ED TTS Assessment: In system Is this a Tele or Face-to-Face Assessment?: Face-to-Face Is this an Initial Assessment or a Re-assessment for this encounter?: Initial Assessment Patient Accompanied by:: N/A Language Other than English: No Living Arrangements: Other (Comment)(With family ) What gender do you identify as?: Male Marital status: Single Pregnancy Status: No Living Arrangements: Other relatives Can pt return to current living arrangement?: Yes Admission Status: Involuntary Petitioner: Family member Is patient capable of signing voluntary admission?: No Referral Source: Self/Family/Friend Insurance type: U[employer]  Medical Screening Exam [facility]) Medical Exam completed: Yes  Crisis Care Plan Living Arrangements: Other relatives Legal Guardian: Other:(None) Name of Psychiatrist: none  Name of Therapist: none   Education Status Is patient currently in school?: No Is the patient employed, unemployed or receiving disability?: Unemployed  Risk to self with the past 6 months Suicidal Ideation: No Has patient been a risk to self within the past 6 months prior to admission? : No Suicidal Intent: No Has patient had any suicidal intent within the past 6 months prior to admission? : No Is patient at risk for suicide?: No, but patient needs Medical Clearance Suicidal Plan?: No Has patient  had any suicidal plan within the past 6 months prior to admission? : No Access to Means: No What has been your use of drugs/alcohol within the last 12 months?: none  Previous  Attempts/Gestures: Yes How many times?: 1 Other Self Harm Risks: none Triggers for Past Attempts: None known Intentional Self Injurious Behavior: None Family Suicide History: No Recent stressful life event(s): Other (Comment) Persecutory voices/beliefs?: No Depression: No Depression Symptoms: (pt deneid ) Substance abuse history and/or treatment for substance abuse?: No Suicide prevention information given to non-admitted patients: Not applicable  Risk to Others within the past 6 months Homicidal Ideation: No Does patient have any lifetime risk of violence toward others beyond the six months prior to admission? : No Thoughts of Harm to Others: No Current Homicidal Intent: No Current Homicidal Plan: No Access to Homicidal Means: No Identified Victim: no reported History of harm to others?: No Assessment of Violence: On admission Violent Behavior Description: verbal aggression  Does patient have access to weapons?: No Criminal Charges Pending?: No Does patient have a court date: No Is patient on probation?: No  Psychosis Hallucinations: None noted Delusions: None noted  Mental Status Report Appearance/Hygiene: In scrubs Eye Contact: Fair Motor Activity: Freedom of movement Speech: Pressured Level of Consciousness: Alert Mood: Suspicious Affect: Apprehensive Anxiety Level: None Thought Processes: Coherent Judgement: Partial Orientation: Time, Situation, Place, Person Obsessive Compulsive Thoughts/Behaviors: None  Cognitive Functioning Concentration: Good Memory: Remote Intact, Recent Intact Is patient IDD: No Impulse Control: Fair Appetite: Fair Have you had any weight changes? : No Change Sleep: No Change Total Hours of Sleep: 6 Vegetative Symptoms: None  ADLScreening [facility] Assessment Services) Patient's cognitive ability adequate to safely complete daily activities?: Yes Patient able to express need for assistance with ADLs?: Yes Independently performs  ADLs?: Yes (appropriate for developmental age)  Prior Inpatient Therapy Prior Inpatient Therapy: Yes Prior Therapy Dates: 2018 [facility] Reason for Treatment: SI  Prior Outpatient Therapy Prior Outpatient Therapy: No Does patient have an ACCT team?: No Does patient have Intensive In-House Services?  : No Does patient have [facility] services? : No Does patient have P4CC services?: No  ADL Screening (condition at time of admission) Patient's cognitive ability adequate to safely complete daily activities?: Yes Patient able to express need for assistance with ADLs?: Yes Independently performs ADLs?: Yes (appropriate for developmental age)       Abuse/Neglect Assessment (Assessment to be complete while patient is alone) Abuse/Neglect Assessment Can Be Completed: Yes Physical Abuse: Denies Verbal Abuse: Denies Sexual Abuse: Denies Exploitation of patient/patient's resources: Denies Values / Beliefs Cultural Requests During Hospitalization: None Spiritual Requests During Hospitalization: None Consults Spiritual Care Consult Needed: No Advance Directives (For Healthcare) Does Patient Have a Medical Advance Directive?: No Would patient like information on creating a medical advance directive?: No - Patient declined          Disposition:  Disposition Initial Assessment Completed for this Encounter: Yes Patient referred to: Other (Comment)([facility]  On Site Evaluation by:   Reviewed with Physician:    Asa SaunasShawanna N Hansen Carino 12/09/2017 4:21 AM

## 2017-12-09 NOTE — ED Notes (Signed)
TTS talking to patient. 

## 2017-12-09 NOTE — ED Notes (Signed)
SOC called back recommend discharge with outpatient resources.

## 2017-12-09 NOTE — ED Notes (Signed)
SOC called report given.  SOC machine set in patients room.  Pt. Woke up and given drink upon request.

## 2017-12-09 NOTE — Discharge Instructions (Addendum)
Take all medicines as directed by your doctor.  Return to the ER for worsening symptoms, feelings of hurting yourself or others, or other concerns.

## 2017-12-09 NOTE — ED Notes (Signed)
Pt. Here IVC.  Pt. States Grandmother issued IVC orders.  Pt. States argument with grandparents tonight.  Pt. States he has been living with grandparents for the past two years.  Pt. States "I was swearing a lot and my grandparents don't like it".  Pt. Denies SI or HI Pt. Also denies AV/H.

## 2017-12-09 NOTE — ED Notes (Signed)
Pt. Getting a ride from family in lobby.

## 2017-12-12 ENCOUNTER — Telehealth: Payer: Self-pay

## 2020-04-07 ENCOUNTER — Emergency Department: Payer: Managed Care, Other (non HMO)

## 2020-04-07 ENCOUNTER — Other Ambulatory Visit: Payer: Self-pay

## 2020-04-07 ENCOUNTER — Emergency Department
Admission: EM | Admit: 2020-04-07 | Discharge: 2020-04-07 | Disposition: A | Discharge status: Home / Self Care | Payer: Managed Care, Other (non HMO) | Attending: Emergency Medicine | Admitting: Emergency Medicine

## 2020-04-07 DIAGNOSIS — J45909 Unspecified asthma, uncomplicated: Secondary | ICD-10-CM | POA: Insufficient documentation

## 2020-04-07 DIAGNOSIS — F17210 Nicotine dependence, cigarettes, uncomplicated: Secondary | ICD-10-CM | POA: Diagnosis not present

## 2020-04-07 DIAGNOSIS — S161XXA Strain of muscle, fascia and tendon at neck level, initial encounter: Secondary | ICD-10-CM | POA: Diagnosis not present

## 2020-04-07 DIAGNOSIS — W108XXA Fall (on) (from) other stairs and steps, initial encounter: Secondary | ICD-10-CM

## 2020-04-07 DIAGNOSIS — S199XXA Unspecified injury of neck, initial encounter: Secondary | ICD-10-CM | POA: Diagnosis present

## 2020-04-07 DIAGNOSIS — S40012A Contusion of left shoulder, initial encounter: Secondary | ICD-10-CM | POA: Diagnosis not present

## 2020-04-07 MED ORDER — MELOXICAM 15 MG PO TABS: 15.0000 mg | ORAL_TABLET | Freq: Every day | ORAL | 0 refills | Status: DC

## 2020-04-07 MED ORDER — METHOCARBAMOL 500 MG PO TABS
1000.0000 mg | ORAL_TABLET | Freq: Once | ORAL | Status: AC
  Filled 2020-04-07: qty 2

## 2020-04-07 MED ORDER — METHOCARBAMOL 500 MG PO TABS
ORAL_TABLET | ORAL | Status: AC
  Administered 2020-04-07: 1000 mg via ORAL
  Filled 2020-04-07: qty 1

## 2020-04-07 MED ORDER — METHOCARBAMOL 500 MG PO TABS: 500.0000 mg | ORAL_TABLET | Freq: Four times a day (QID) | ORAL | 0 refills | Status: AC

## 2020-04-07 MED ORDER — MELOXICAM 7.5 MG PO TABS
15.0000 mg | ORAL_TABLET | Freq: Once | ORAL | Status: AC
  Administered 2020-04-07: 15 mg via ORAL
  Filled 2020-04-07: qty 2

## 2020-04-07 NOTE — ED Provider Notes (Signed)
[facility] Emergency Department Provider Note  ____________________________________________  Time seen: Approximately 9:07 PM  I have reviewed the triage vital signs and the nursing notes.   HISTORY  Chief Complaint Shoulder Injury    HPI Charles Burke is a 25 y.o. male who presents the emergency department complaining of left shoulder pain and neck pain after a fall.  Patient states that he fell down some stairs approximately 10 days ago.  He states that he lost his balance, try to grab onto a handle and fell  landing on his left shoulder.  Patient has pain in the left shoulder and left neck.  He states that he is having difficulty rotating his neck to the left but has good extension, flexion and rotation to the right.  He has had some difficulty grasping firmly with the left upper extremity.  Limited range of motion of the shoulder due to pain.  Most of the patient's pain is along the shoulder blade itself.  Patient is tried over-the-counter medications with no relief of symptoms.  Patient denies any loss consciousness at the time of injury.  No ongoing headaches or visual changes.  No chest pain, shortness of breath, abdominal pain.        Past Medical History:  Diagnosis Date  . ADHD (attention deficit hyperactivity disorder)   . Anxiety   . Bipolar disorder (HCC)   . Depression     Patient Active Problem List   Diagnosis Date Noted  . Spell of abnormal behavior 03/15/2017  . Seizure-like activity (HCC) 01/31/2017  . Seizure (HCC) 01/22/2017  . Asthma 08/16/2016  . Bipolar I disorder, current or most recent episode depressed, with psychotic features (HCC) 08/15/2016  . Suicidal ideation 08/15/2016  . Cocaine use disorder, moderate, dependence (HCC) 08/15/2016  . Cannabis use disorder, moderate, dependence (HCC) 08/15/2016    No past surgical history on file.  Prior to Admission medications   Medication Sig Start Date End Date Taking?  Authorizing Provider  meloxicam (MOBIC) 15 MG tablet Take 1 tablet (15 mg total) by mouth daily. 04/07/20  Yes Kyisha Fowle, Delorise Royals, PA-C  methocarbamol (ROBAXIN) 500 MG tablet Take 1 tablet (500 mg total) by mouth 4 (four) times daily. 04/07/20  Yes Candies Palm, Delorise Royals, PA-C  ARIPiprazole (ABILIFY) 10 MG tablet Take 1 tablet (10 mg total) by mouth daily. 06/12/17   [name], MD  Fluticasone-Salmeterol (ADVAIR DISKUS) 100-50 MCG/DOSE AEPB Inhale into the lungs.    [provider]  fluvoxaMINE (LUVOX) 100 MG tablet Take 1 tablet (100 mg total) by mouth at bedtime. 06/12/17   [name], MD  traZODone (DESYREL) 100 MG tablet Take 1 tablet (100 mg total) by mouth at bedtime as needed for sleep. 06/12/17   [name], MD    Allergies Bee venom  Family History  Problem Relation Age of Onset  . Anxiety disorder Mother   . Depression Mother     Social History Social History   Tobacco Use  . Smoking status: Current Some Day Smoker    Packs/day: 1.00    Types: Cigarettes  . Smokeless tobacco: Never Used  Vaping Use  . Vaping Use: Never used  Substance Use Topics  . Alcohol use: No  . Drug use: Yes    Types: Cocaine, Marijuana    Comment: last used 2 weeks ago     Review of Systems  Constitutional: No fever/chills Eyes: No visual changes. No discharge ENT: No upper respiratory complaints. Cardiovascular: no chest pain.  Respiratory: no cough. No SOB. Gastrointestinal: No abdominal pain.  No nausea, no vomiting.  No diarrhea.  No constipation. Genitourinary: Negative for dysuria. No hematuria Musculoskeletal: Positive for left neck/shoulder pain after a fall 10 days ago Skin: Negative for rash, abrasions, lacerations, ecchymosis. Neurological: Negative for headaches, focal weakness or numbness.  10 System ROS otherwise negative.  ____________________________________________   PHYSICAL EXAM:  VITAL SIGNS: ED Triage Vitals  Enc Vitals Group     BP --       Pulse Rate 04/07/20 2024 95     Resp 04/07/20 2024 20     Temp 04/07/20 2024 98.3 F (36.8 C)     Temp Source 04/07/20 2024 Oral     SpO2 04/07/20 2024 97 %     Weight 04/07/20 2025 150 lb (68 kg)     Height 04/07/20 2025 5\' 7"  (1.702 m)     Head Circumference --      Peak Flow --      Pain Score 04/07/20 2025 7     Pain Loc --      Pain Edu? --      Excl. in GC? --      Constitutional: Alert and oriented. Well appearing and in no acute distress. Eyes: Conjunctivae are normal. PERRL. EOMI. Head: Atraumatic. ENT:      Ears:       Nose: No congestion/rhinnorhea.      Mouth/Throat: Mucous membranes are moist.  Neck: No stridor.  Patient is having tenderness along the left paraspinal muscle group and midline.  This extends along the posterior shoulder.  There is some appreciable spasms in the left paraspinal muscle group.  No palpable abnormality or step-off.  Radial pulses sensation intact and equal bilateral upper extremities  Cardiovascular: Normal rate, regular rhythm. Normal S1 and S2.  Good peripheral circulation. Respiratory: Normal respiratory effort without tachypnea or retractions. Lungs CTAB. Good air entry to the bases with no decreased or absent breath sounds. Musculoskeletal: Full range of motion to all extremities. No gross deformities appreciated.  Visualization of the left shoulder and back reveals no visible signs of trauma with abrasions, lacerations, ecchymosis or edema.  Limited range of motion due to pain though patient has good passive range of motion.  Very tender to palpation along the left shoulder blade and scapular ridge.  No palpable abnormalities or deficits.  No tenderness over the proximal humerus.  Mild tenderness along the superior aspect of the shoulder in the musculature but no tenderness over the clavicle.  Examination of the rest of the left upper extremity is unremarkable. Neurologic:  Normal speech and language. No gross focal neurologic deficits  are appreciated.  Skin:  Skin is warm, dry and intact. No rash noted. Psychiatric: Mood and affect are normal. Speech and behavior are normal. Patient exhibits appropriate insight and judgement.   ____________________________________________   LABS (all labs ordered are listed, but only abnormal results are displayed)  Labs Reviewed - No data to display ____________________________________________  EKG   ____________________________________________  RADIOLOGY I personally viewed and evaluated these images as part of my medical decision making, as well as reviewing the written report by the radiologist.  ED Provider Interpretation: No acute traumatic findings on imaging  DG Ribs Unilateral W/Chest Left  Result Date: 04/07/2020 CLINICAL DATA:  04/09/2020 out of deer stand 1 week ago, left upper posterior chest pain EXAM: LEFT RIBS AND CHEST - 3+ VIEW COMPARISON:  None. FINDINGS: Frontal view of the chest as well as  frontal and oblique views of the left thoracic cage are obtained. Cardiac silhouette is unremarkable. No airspace disease, effusion, or pneumothorax. There are no acute displaced fractures. IMPRESSION: 1. No acute intrathoracic process. Electronically Signed   By: Sharlet SalinaMichael  Brown M.D.   On: 04/07/2020 21:32   DG Cervical Spine 2-3 Views  Result Date: 04/07/2020 CLINICAL DATA:  Magda BernheimFell [place] deer stand 1 week ago, left neck and shoulder pain EXAM: CERVICAL SPINE - 2-3 VIEW COMPARISON:  None. FINDINGS: Frontal and lateral views of the cervical spine are obtained. Alignment is anatomic to the cervicothoracic junction. There are no acute displaced fractures. Disc spaces are well preserved. Soft tissues are unremarkable. IMPRESSION: 1. Unremarkable cervical spine. Electronically Signed   By: Sharlet SalinaMichael  Brown M.D.   On: 04/07/2020 21:34   DG Shoulder Left  Result Date: 04/07/2020 CLINICAL DATA:  Larey SeatFell out of deer stand 1 week ago, left shoulder pain EXAM: LEFT SHOULDER - 2+ VIEW COMPARISON:   None. FINDINGS: Frontal, transscapular, and axillary views of the left shoulder are obtained. No fracture, subluxation, or dislocation. Joint spaces are well preserved. Left chest is clear. IMPRESSION: 1. Unremarkable left shoulder. Electronically Signed   By: Sharlet SalinaMichael  Brown M.D.   On: 04/07/2020 21:33    ____________________________________________    PROCEDURES  Procedure(s) performed:    Procedures    Medications  meloxicam (MOBIC) tablet 15 mg (has no administration in time range)  methocarbamol (ROBAXIN) tablet 1,000 mg (has no administration in time range)     ____________________________________________   INITIAL IMPRESSION / ASSESSMENT AND PLAN / ED COURSE  Pertinent labs & imaging results that were available during my care of the patient were reviewed by me and considered in my medical decision making (see chart for details).  Review of the [place] CSRS was performed in accordance of the [facility] prior to dispensing any controlled drugs.           Patient's diagnosis is consistent with fall, contusion, cervical strain.  Patient presented to emergency department after falling and landing on the left shoulder.  This occurred 10 days ago.  No concerning neuro deficits.  Patient had tenderness along the left paraspinal muscle group extending into the scapular region.  Imaging revealed no acute traumatic findings.  Differential included contusion versus strain versus fracture of the cervical spine, scapula, pneumothorax, rib fracture.  At this time I will treat with anti-inflammatory and muscle relaxer.  Return precautions are discussed with the patient.  Follow-up primary care as needed..  Patient is given ED precautions to return to the ED for any worsening or new symptoms.     ____________________________________________  FINAL CLINICAL IMPRESSION(S) / ED DIAGNOSES  Final diagnoses:  Fall (on) (from) other stairs and steps, initial encounter  Contusion of left shoulder,  initial encounter  Strain of neck muscle, initial encounter      NEW MEDICATIONS STARTED DURING THIS VISIT:  ED Discharge Orders         Ordered    meloxicam (MOBIC) 15 MG tablet  Daily        04/07/20 2229    methocarbamol (ROBAXIN) 500 MG tablet  4 times daily        04/07/20 2229              This chart was dictated using voice recognition software/Dragon. Despite best efforts to proofread, errors can occur which can change the meaning. Any change was purely unintentional.    Racheal PatchesCuthriell, Jontavius Rabalais D, PA-C 04/07/20 2230  Merwyn Katos, MD 04/07/20 [phone]

## 2020-04-07 NOTE — ED Triage Notes (Signed)
Pt in with co left shoulder and left scapula pain for over a week. States started when he fell out of deer stand.

## 2020-10-11 ENCOUNTER — Encounter: Payer: Self-pay | Admitting: *Deleted

## 2020-10-11 ENCOUNTER — Other Ambulatory Visit: Payer: Self-pay

## 2020-10-11 ENCOUNTER — Emergency Department
Admission: EM | Admit: 2020-10-11 | Discharge: 2020-10-12 | Disposition: A | Discharge status: Home / Self Care | Payer: Managed Care, Other (non HMO) | Attending: Emergency Medicine | Admitting: Emergency Medicine

## 2020-10-11 DIAGNOSIS — R4689 Other symptoms and signs involving appearance and behavior: Secondary | ICD-10-CM

## 2020-10-11 DIAGNOSIS — F909 Attention-deficit hyperactivity disorder, unspecified type: Secondary | ICD-10-CM | POA: Insufficient documentation

## 2020-10-11 DIAGNOSIS — F17210 Nicotine dependence, cigarettes, uncomplicated: Secondary | ICD-10-CM | POA: Diagnosis not present

## 2020-10-11 DIAGNOSIS — F602 Antisocial personality disorder: Secondary | ICD-10-CM

## 2020-10-11 DIAGNOSIS — F319 Bipolar disorder, unspecified: Secondary | ICD-10-CM | POA: Diagnosis not present

## 2020-10-11 DIAGNOSIS — R454 Irritability and anger: Secondary | ICD-10-CM | POA: Insufficient documentation

## 2020-10-11 DIAGNOSIS — Z046 Encounter for general psychiatric examination, requested by authority: Secondary | ICD-10-CM | POA: Diagnosis present

## 2020-10-11 DIAGNOSIS — J45909 Unspecified asthma, uncomplicated: Secondary | ICD-10-CM | POA: Diagnosis not present

## 2020-10-11 DIAGNOSIS — Z79899 Other long term (current) drug therapy: Secondary | ICD-10-CM | POA: Insufficient documentation

## 2020-10-11 DIAGNOSIS — Z20822 Contact with and (suspected) exposure to covid-19: Secondary | ICD-10-CM | POA: Diagnosis not present

## 2020-10-11 DIAGNOSIS — Z7951 Long term (current) use of inhaled steroids: Secondary | ICD-10-CM | POA: Diagnosis not present

## 2020-10-11 LAB — CBC WITH DIFFERENTIAL/PLATELET
Abs Immature Granulocytes: 0.08 10*3/uL — ABNORMAL HIGH (ref 0.00–0.07)
Basophils Absolute: 0.1 10*3/uL (ref 0.0–0.1)
Basophils Relative: 1 %
Eosinophils Absolute: 0.1 10*3/uL (ref 0.0–0.5)
Eosinophils Relative: 1 %
HCT: 48.1 % (ref 39.0–52.0)
Hemoglobin: 16.3 g/dL (ref 13.0–17.0)
Immature Granulocytes: 1 %
Lymphocytes Relative: 27 %
Lymphs Abs: 2.9 10*3/uL (ref 0.7–4.0)
MCH: 31.8 pg (ref 26.0–34.0)
MCHC: 33.9 g/dL (ref 30.0–36.0)
MCV: 93.9 fL (ref 80.0–100.0)
Monocytes Absolute: 1 10*3/uL (ref 0.1–1.0)
Monocytes Relative: 10 %
Neutro Abs: 6.6 10*3/uL (ref 1.7–7.7)
Neutrophils Relative %: 60 %
Platelets: 213 10*3/uL (ref 150–400)
RBC: 5.12 MIL/uL (ref 4.22–5.81)
RDW: 11.9 % (ref 11.5–15.5)
WBC: 10.8 10*3/uL — ABNORMAL HIGH (ref 4.0–10.5)
nRBC: 0 % (ref 0.0–0.2)

## 2020-10-11 LAB — COMPREHENSIVE METABOLIC PANEL
ALT: 22 U/L (ref 0–44)
AST: 23 U/L (ref 15–41)
Albumin: 4.2 g/dL (ref 3.5–5.0)
Alkaline Phosphatase: 32 U/L — ABNORMAL LOW (ref 38–126)
Anion gap: 6 (ref 5–15)
BUN: 12 mg/dL (ref 6–20)
CO2: 29 mmol/L (ref 22–32)
Calcium: 9.2 mg/dL (ref 8.9–10.3)
Chloride: 104 mmol/L (ref 98–111)
Creatinine, Ser: 0.81 mg/dL (ref 0.61–1.24)
GFR, Estimated: >60 mL/min (ref >60)
Glucose, Bld: 64 mg/dL — ABNORMAL LOW (ref 70–99)
Potassium: 4.1 mmol/L (ref 3.5–5.1)
Sodium: 139 mmol/L (ref 135–145)
Total Bilirubin: 0.6 mg/dL (ref 0.3–1.2)
Total Protein: 6.9 g/dL (ref 6.5–8.1)

## 2020-10-11 LAB — ETHANOL: Alcohol, Ethyl (B): <10 mg/dL (ref <10)

## 2020-10-11 LAB — RESP PANEL BY RT-PCR (FLU A&B, COVID) ARPGX2
Influenza A by PCR: NEGATIVE
Influenza B by PCR: NEGATIVE
SARS Coronavirus 2 by RT PCR: NEGATIVE
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
## 2020-10-11 NOTE — ED Notes (Signed)
This writer was monitoring pt getting dressed out, and Pt. Was provided with hospital attire (top&bottom) scrubs, and socks, underwear. Pt. Belonging were labeled with pt. Stickers. Labeled Bags are 2 of 2.   Pt. Belongings are:  Pair of brown boots Pair of green socks Brown belt Underwear Neon Shirt [id number]

## 2020-10-12 DIAGNOSIS — F602 Antisocial personality disorder: Secondary | ICD-10-CM
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
## 2020-10-12 NOTE — ED Notes (Signed)
VOL/ Consult ordered  

## 2020-10-12 NOTE — ED Notes (Signed)
E-signature not working at this time. Pt verbalized understanding of D/C instructions, prescriptions and follow up care with no further questions at this time. Pt in NAD and ambulatory at time of D/C.  

## 2020-10-12 NOTE — ED Provider Notes (Signed)
Emergency Medicine Observation Re-evaluation Note  Charles Burke is a 25 y.o. male, seen on rounds today.  Pt initially presented to the ED for complaints of Psychiatric Evaluation Currently, the patient is resting, voices no medical complaints.  Physical Exam  BP (!) 115/98 (BP Location: Left Arm)   Pulse 76   Temp 98.6 F (37 C) (Oral)   Resp 18   Ht 5\' 6"  (1.676 m)   Wt 54.4 kg   SpO2 100%   BMI 19.37 kg/m  Physical Exam General: Resting in no acute distress Cardiac: No cyanosis Lungs: Equal rise and fall Psych: Not agitated  ED Course / MDM  EKG:   I have reviewed the labs performed to date as well as medications administered while in observation.  Recent changes in the last 24 hours include no events overnight.  Plan  Current plan is for psychiatric disposition.  Torin Criag Wicklund is not under involuntary commitment.     Mathis Dad, MD 10/12/20 [phone]
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
## 2020-10-12 NOTE — ED Notes (Signed)
Patient given breakfast tray.

## 2020-10-30 ENCOUNTER — Emergency Department (HOSPITAL_COMMUNITY)
Admission: EM | Admit: 2020-10-30 | Discharge: 2020-10-31 | Payer: Managed Care, Other (non HMO) | Attending: Emergency Medicine | Admitting: Emergency Medicine

## 2020-10-30 ENCOUNTER — Encounter (HOSPITAL_COMMUNITY): Payer: Self-pay

## 2020-10-30 ENCOUNTER — Other Ambulatory Visit: Payer: Self-pay

## 2020-10-30 DIAGNOSIS — F17210 Nicotine dependence, cigarettes, uncomplicated: Secondary | ICD-10-CM | POA: Diagnosis not present

## 2020-10-30 DIAGNOSIS — N342 Other urethritis: Secondary | ICD-10-CM | POA: Diagnosis not present

## 2020-10-30 DIAGNOSIS — J45909 Unspecified asthma, uncomplicated: Secondary | ICD-10-CM | POA: Diagnosis not present

## 2020-10-30 DIAGNOSIS — Z79899 Other long term (current) drug therapy: Secondary | ICD-10-CM | POA: Diagnosis not present

## 2020-10-30 DIAGNOSIS — R369 Urethral discharge, unspecified: Secondary | ICD-10-CM | POA: Diagnosis present

## 2020-10-30 HISTORY — DX: Unilateral inguinal hernia, without obstruction or gangrene, not specified as recurrent: K40.90

## 2020-10-30 LAB — URINALYSIS, MICROSCOPIC (REFLEX)
Squamous Epithelial / HPF: NONE SEEN (ref 0–5)
WBC, UA: >50 WBC/hpf (ref 0–5)

## 2020-10-30 LAB — LIPASE, BLOOD: Lipase: 25 U/L (ref 11–51)

## 2020-10-30 LAB — URINALYSIS, ROUTINE W REFLEX MICROSCOPIC
Bilirubin Urine: NEGATIVE
Glucose, UA: NEGATIVE mg/dL
Ketones, ur: NEGATIVE mg/dL
Nitrite: NEGATIVE
Protein, ur: 30 mg/dL — AB
Specific Gravity, Urine: 1.02 (ref 1.005–1.030)
pH: 8 (ref 5.0–8.0)

## 2020-10-30 LAB — CBC
HCT: 39.9 % (ref 39.0–52.0)
Hemoglobin: 13.3 g/dL (ref 13.0–17.0)
MCH: 31.1 pg (ref 26.0–34.0)
MCHC: 33.3 g/dL (ref 30.0–36.0)
MCV: 93.4 fL (ref 80.0–100.0)
Platelets: 198 10*3/uL (ref 150–400)
RBC: 4.27 MIL/uL (ref 4.22–5.81)
RDW: 12 % (ref 11.5–15.5)
WBC: 11.7 10*3/uL — ABNORMAL HIGH (ref 4.0–10.5)
nRBC: 0 % (ref 0.0–0.2)

## 2020-10-30 LAB — COMPREHENSIVE METABOLIC PANEL
ALT: 23 U/L (ref 0–44)
AST: 20 U/L (ref 15–41)
Albumin: 3.5 g/dL (ref 3.5–5.0)
Alkaline Phosphatase: 41 U/L (ref 38–126)
Anion gap: 8 (ref 5–15)
BUN: 14 mg/dL (ref 6–20)
CO2: 28 mmol/L (ref 22–32)
Calcium: 9.3 mg/dL (ref 8.9–10.3)
Chloride: 101 mmol/L (ref 98–111)
Creatinine, Ser: 1.01 mg/dL (ref 0.61–1.24)
GFR, Estimated: >60 mL/min (ref >60)
Glucose, Bld: 109 mg/dL — ABNORMAL HIGH (ref 70–99)
Potassium: 4.2 mmol/L (ref 3.5–5.1)
Sodium: 137 mmol/L (ref 135–145)
Total Bilirubin: 0.7 mg/dL (ref 0.3–1.2)
Total Protein: 6.3 g/dL — ABNORMAL LOW (ref 6.5–8.1)

## 2020-10-30 MED ORDER — DOXYCYCLINE HYCLATE 100 MG PO TABS
100.0000 mg | ORAL_TABLET | Freq: Once | ORAL | Status: AC
  Administered 2020-10-31: 100 mg via ORAL
  Filled 2020-10-30: qty 1

## 2020-10-30 MED ORDER — CEFTRIAXONE SODIUM 500 MG IJ SOLR
500.0000 mg | Freq: Once | INTRAMUSCULAR | Status: AC
  Administered 2020-10-31: 500 mg via INTRAMUSCULAR
  Filled 2020-10-30: qty 500

## 2020-10-30 MED ORDER — METRONIDAZOLE 500 MG PO TABS
2000.0000 mg | ORAL_TABLET | Freq: Once | ORAL | Status: AC
  Administered 2020-10-31: 2000 mg via ORAL
  Filled 2020-10-30: qty 4

## 2020-10-30 NOTE — ED Triage Notes (Addendum)
PER EMS: pt is from a rehab facility "[facility]," He states he coughed tonight and started having right groin pain, hx of hernia.   BP- 118/74, HR-80, 98% RA

## 2020-10-30 NOTE — ED Provider Notes (Signed)
[facility] EMERGENCY DEPARTMENT Provider Note   CSN: [id number] Arrival date & time: 10/30/20  2056     History Chief Complaint  Patient presents with   Hernia    Charles Burke is a 25 y.o. male.  Patient presents to the emergency department for evaluation of groin pain.  Patient reports that he has been told that he has a hernia in the right groin.  Patient has felt a lump in the area for some time, at least a week.  Patient reports that he coughed forcefully tonight and the area started to become painful.  No associated nausea, vomiting, abdominal pain.  Patient does endorse urethral discharge.  He reports that it is yellow and like semen.  No external lesions.  He has had unprotected sex.      Past Medical History:  Diagnosis Date   ADHD (attention deficit hyperactivity disorder)    Anxiety    Bipolar disorder (HCC)    Depression    Inguinal hernia    right    Patient Active Problem List   Diagnosis Date Noted   Antisocial personality disorder (HCC) 10/12/2020   Spell of abnormal behavior 03/15/2017   Seizure-like activity (HCC) 01/31/2017   Seizure (HCC) 01/22/2017   Asthma 08/16/2016   Bipolar I disorder, current or most recent episode depressed, with psychotic features (HCC) 08/15/2016   Suicidal ideation 08/15/2016   Cocaine use disorder, moderate, dependence (HCC) 08/15/2016   Cannabis use disorder, moderate, dependence (HCC) 08/15/2016    History reviewed. No pertinent surgical history.     Family History  Problem Relation Age of Onset   Anxiety disorder Mother    Depression Mother     Social History   Tobacco Use   Smoking status: Every Day    Packs/day: 1.00    Types: Cigarettes   Smokeless tobacco: Never  Vaping Use   Vaping Use: Never used  Substance Use Topics   Alcohol use: No   Drug use: Yes    Types: Cocaine, Marijuana    Comment: cocaine last used 10/28/20    Home Medications Prior to Admission  medications   Medication Sig Start Date End Date Taking? Authorizing Provider  doxycycline (VIBRAMYCIN) 100 MG capsule Take 1 capsule (100 mg total) by mouth 2 (two) times daily. 10/30/20  Yes Sherriann Szuch, Canary Brim, MD  ARIPiprazole (ABILIFY) 10 MG tablet Take 1 tablet (10 mg total) by mouth daily. 06/12/17   [name], MD  Fluticasone-Salmeterol (ADVAIR DISKUS) 100-50 MCG/DOSE AEPB Inhale into the lungs.    [provider]  fluvoxaMINE (LUVOX) 100 MG tablet Take 1 tablet (100 mg total) by mouth at bedtime. 06/12/17   [name], MD  meloxicam (MOBIC) 15 MG tablet Take 1 tablet (15 mg total) by mouth daily. 04/07/20   Cuthriell, Delorise Royals, PA-C  methocarbamol (ROBAXIN) 500 MG tablet Take 1 tablet (500 mg total) by mouth 4 (four) times daily. 04/07/20   Cuthriell, Delorise Royals, PA-C  traZODone (DESYREL) 100 MG tablet Take 1 tablet (100 mg total) by mouth at bedtime as needed for sleep. 06/12/17   [name], MD    Allergies    Bee venom  Review of Systems   Review of Systems  Constitutional:  Negative for fever.  Gastrointestinal:  Negative for nausea and vomiting.  Genitourinary:  Positive for penile discharge.  All other systems reviewed and are negative.  Physical Exam Updated Vital Signs BP 107/64   Pulse 85   Temp 99 F (  37.2 C) (Oral)   Resp (!) 22   Ht 5\' 6"  (1.676 m)   Wt 59 kg   SpO2 98%   BMI 20.98 kg/m   Physical Exam Vitals and nursing note reviewed. Exam conducted with a chaperone present.  Constitutional:      General: He is not in acute distress.    Appearance: Normal appearance. He is well-developed.  HENT:     Head: Normocephalic and atraumatic.     Right Ear: Hearing normal.     Left Ear: Hearing normal.     Nose: Nose normal.  Eyes:     Conjunctiva/sclera: Conjunctivae normal.     Pupils: Pupils are equal, round, and reactive to light.  Cardiovascular:     Rate and Rhythm: Regular rhythm.     Heart sounds: S1 normal and S2 normal. No  murmur heard.   No friction rub. No gallop.  Pulmonary:     Effort: Pulmonary effort is normal. No respiratory distress.     Breath sounds: Normal breath sounds.  Chest:     Chest wall: No tenderness.  Abdominal:     General: Bowel sounds are normal.     Palpations: Abdomen is soft.     Tenderness: There is no abdominal tenderness. There is no guarding or rebound. Negative signs include Murphy's sign and McBurney's sign.     Hernia: No hernia is present. There is no hernia in the left inguinal area or right inguinal area.  Genitourinary:    Penis: Discharge present. No erythema or lesions.      Testes: Normal.  Musculoskeletal:        General: Normal range of motion.     Cervical back: Normal range of motion and neck supple.  Lymphadenopathy:     Lower Body: Right inguinal adenopathy present.  Skin:    General: Skin is warm and dry.     Findings: No rash.  Neurological:     Mental Status: He is alert and oriented to person, place, and time.     GCS: GCS eye subscore is 4. GCS verbal subscore is 5. GCS motor subscore is 6.     Cranial Nerves: No cranial nerve deficit.     Sensory: No sensory deficit.     Coordination: Coordination normal.  Psychiatric:        Speech: Speech normal.        Behavior: Behavior normal.        Thought Content: Thought content normal.    ED Results / Procedures / Treatments   Labs (all labs ordered are listed, but only abnormal results are displayed) Labs Reviewed  COMPREHENSIVE METABOLIC PANEL - Abnormal; Notable for the following components:      Result Value   Glucose, Bld 109 (*)    Total Protein 6.3 (*)    All other components within normal limits  CBC - Abnormal; Notable for the following components:   WBC 11.7 (*)    All other components within normal limits  URINALYSIS, ROUTINE W REFLEX MICROSCOPIC - Abnormal; Notable for the following components:   APPearance CLOUDY (*)    Hgb urine dipstick TRACE (*)    Protein, ur 30 (*)     Leukocytes,Ua LARGE (*)    All other components within normal limits  URINALYSIS, MICROSCOPIC (REFLEX) - Abnormal; Notable for the following components:   Bacteria, UA RARE (*)    All other components within normal limits  LIPASE, BLOOD  RPR  HIV ANTIBODY (ROUTINE TESTING W  REFLEX)  GC/CHLAMYDIA PROBE AMP ([place]) NOT AT [facility]    EKG None  Radiology No results found.  Procedures Procedures   Medications Ordered in ED Medications  cefTRIAXone (ROCEPHIN) injection 500 mg (has no administration in time range)  metroNIDAZOLE (FLAGYL) tablet 2,000 mg (has no administration in time range)  doxycycline (VIBRA-TABS) tablet 100 mg (has no administration in time range)    ED Course  I have reviewed the triage vital signs and the nursing notes.  Pertinent labs & imaging results that were available during my care of the patient were reviewed by me and considered in my medical decision making (see chart for details).    MDM Rules/Calculators/A&P                           Patient concerned about right inguinal hernia.  I do not appreciate any inguinal hernia at this time.  Patient does have a 1.5 cm lymph node at the inguinal crease that is tender to palpation.  This is likely reactive as he does have urethritis.  GC and Chlamydia, HIV, RPR pending.  Will treat empirically.  Final Clinical Impression(s) / ED Diagnoses Final diagnoses:  Urethritis    Rx / DC Orders ED Discharge Orders          Ordered    doxycycline (VIBRAMYCIN) 100 MG capsule  2 times daily        10/31/20 0000             Gilda Crease, MD 10/31/20 0000

## 2020-10-31 LAB — RPR: RPR Ser Ql: NONREACTIVE

## 2020-10-31 LAB — HIV ANTIBODY (ROUTINE TESTING W REFLEX): HIV Screen 4th Generation wRfx: NONREACTIVE

## 2020-10-31 MED ORDER — LIDOCAINE HCL (PF) 1 % IJ SOLN
INTRAMUSCULAR | Status: AC
  Filled 2020-10-31: qty 5

## 2020-10-31 MED ORDER — DOXYCYCLINE HYCLATE 100 MG PO CAPS: 100.0000 mg | ORAL_CAPSULE | Freq: Two times a day (BID) | ORAL | 0 refills | Status: AC

## 2020-10-31 NOTE — ED Notes (Signed)
Spoke w/[facility].  Visit report faxed to [facility].  Awaiting confirmation that pt is accepted back to facility.

## 2020-11-01 LAB — GC/CHLAMYDIA PROBE AMP (~~LOC~~) NOT AT ARMC
Chlamydia: POSITIVE — AB
Comment: NEGATIVE
Comment: NORMAL
Neisseria Gonorrhea: POSITIVE — AB

## 2021-02-16 ENCOUNTER — Encounter: Payer: Self-pay | Admitting: Emergency Medicine

## 2021-02-16 ENCOUNTER — Emergency Department
Admission: EM | Admit: 2021-02-16 | Discharge: 2021-02-16 | Disposition: A | Discharge status: Home / Self Care | Payer: Managed Care, Other (non HMO) | Attending: Emergency Medicine | Admitting: Emergency Medicine

## 2021-02-16 ENCOUNTER — Other Ambulatory Visit: Payer: Self-pay

## 2021-02-16 DIAGNOSIS — F1423 Cocaine dependence with withdrawal: Secondary | ICD-10-CM | POA: Insufficient documentation

## 2021-02-16 DIAGNOSIS — Z5321 Procedure and treatment not carried out due to patient leaving prior to being seen by health care provider: Secondary | ICD-10-CM | POA: Insufficient documentation

## 2021-02-16 NOTE — ED Triage Notes (Signed)
First Nurse Note:  Arrives requesting detox.  AAOx3.  Calm and cooperative.  Skin warm and dry. NAD

## 2021-02-16 NOTE — ED Triage Notes (Signed)
Pt reports would like to enter a detox from cocaine. Pt reports has been using it for the past 1-2 years a couple of days a week. Pt reports last used today.

## 2021-12-08 ENCOUNTER — Other Ambulatory Visit: Payer: Self-pay | Admitting: Student

## 2021-12-08 DIAGNOSIS — S43431A Superior glenoid labrum lesion of right shoulder, initial encounter: Secondary | ICD-10-CM

## 2021-12-08 DIAGNOSIS — S4351XA Sprain of right acromioclavicular joint, initial encounter: Secondary | ICD-10-CM

## 2021-12-08 DIAGNOSIS — M7521 Bicipital tendinitis, right shoulder: Secondary | ICD-10-CM

## 2021-12-08 DIAGNOSIS — M7581 Other shoulder lesions, right shoulder: Secondary | ICD-10-CM

## 2022-01-24 ENCOUNTER — Inpatient Hospital Stay: Admission: RE | Admit: 2022-01-24 | Payer: Managed Care, Other (non HMO) | Source: Ambulatory Visit

## 2022-01-24 ENCOUNTER — Other Ambulatory Visit: Payer: Managed Care, Other (non HMO)

## 2022-01-30 IMAGING — CR DG CERVICAL SPINE 2 OR 3 VIEWS
1 series · 4 of 4 positions shown · non-contrast
Comparison: None.

CLINICAL DATA: Fell [place] 1 week ago, left neck and
shoulder pain

EXAM:
CERVICAL SPINE - 2-3 VIEW

[Series 1: dg cervical spine 2 or 3 views · 0.14mm/px · 4 of 4 slices shown]
[im 1/4]
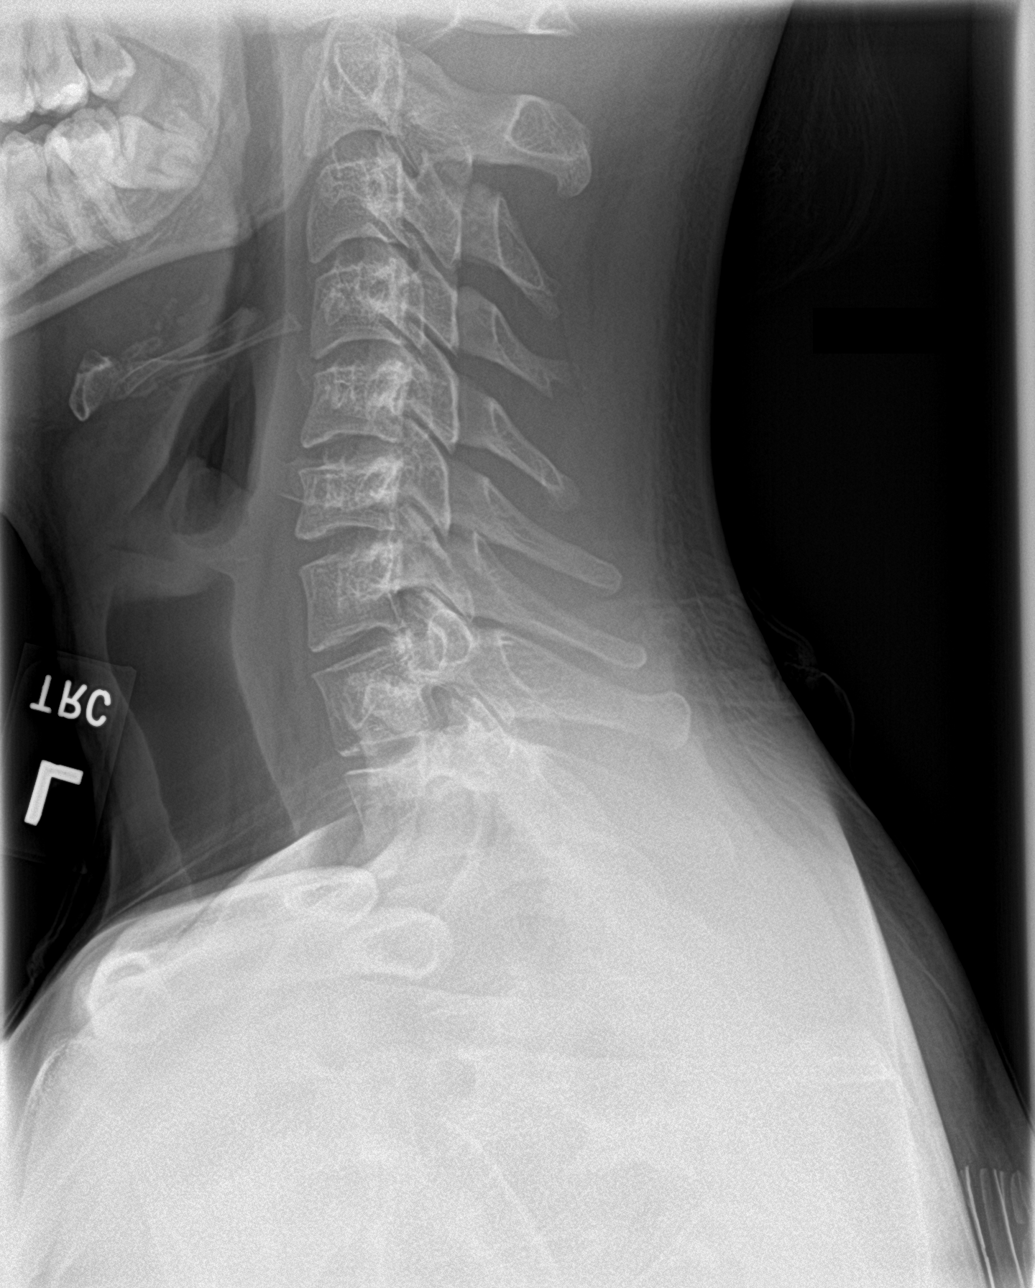
[im 2/4]
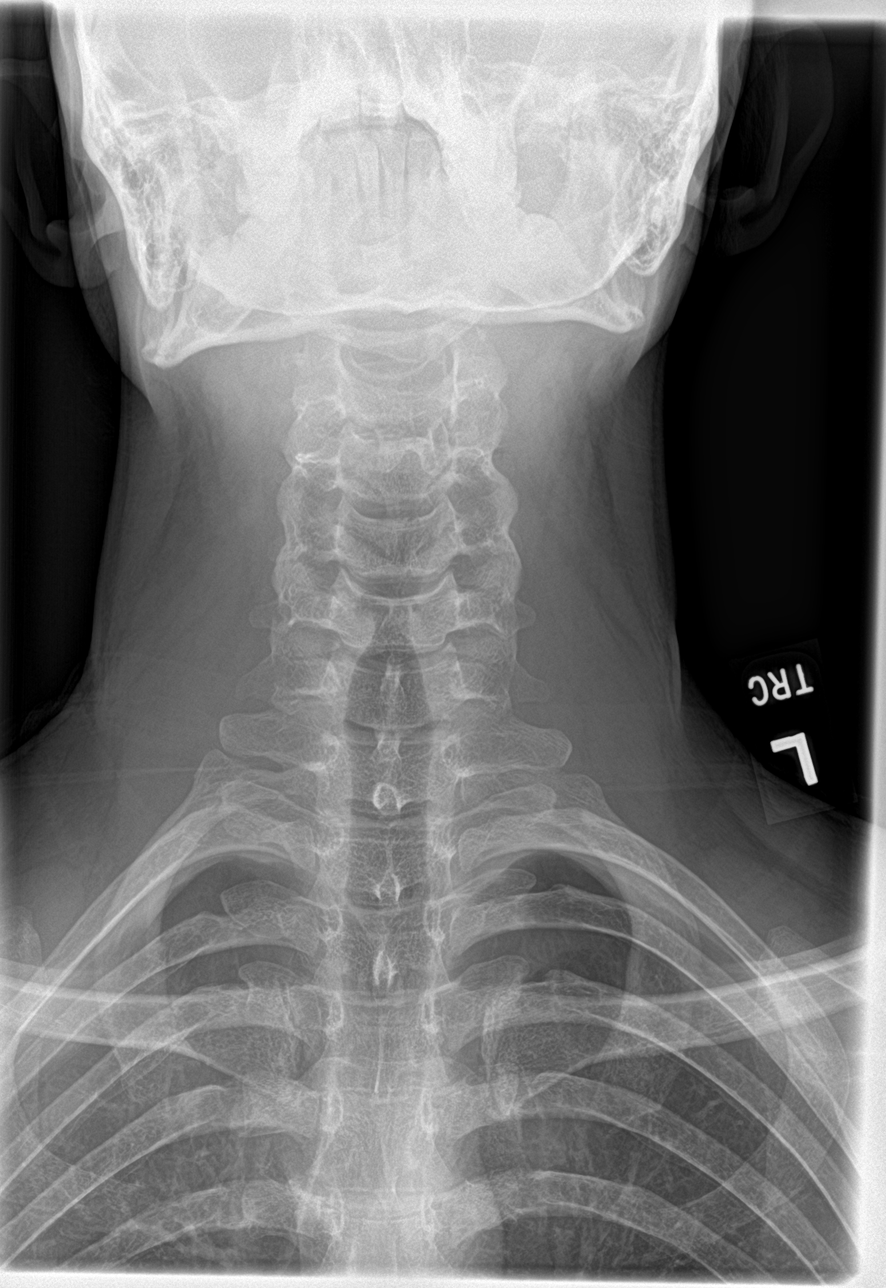
[im 3/4]
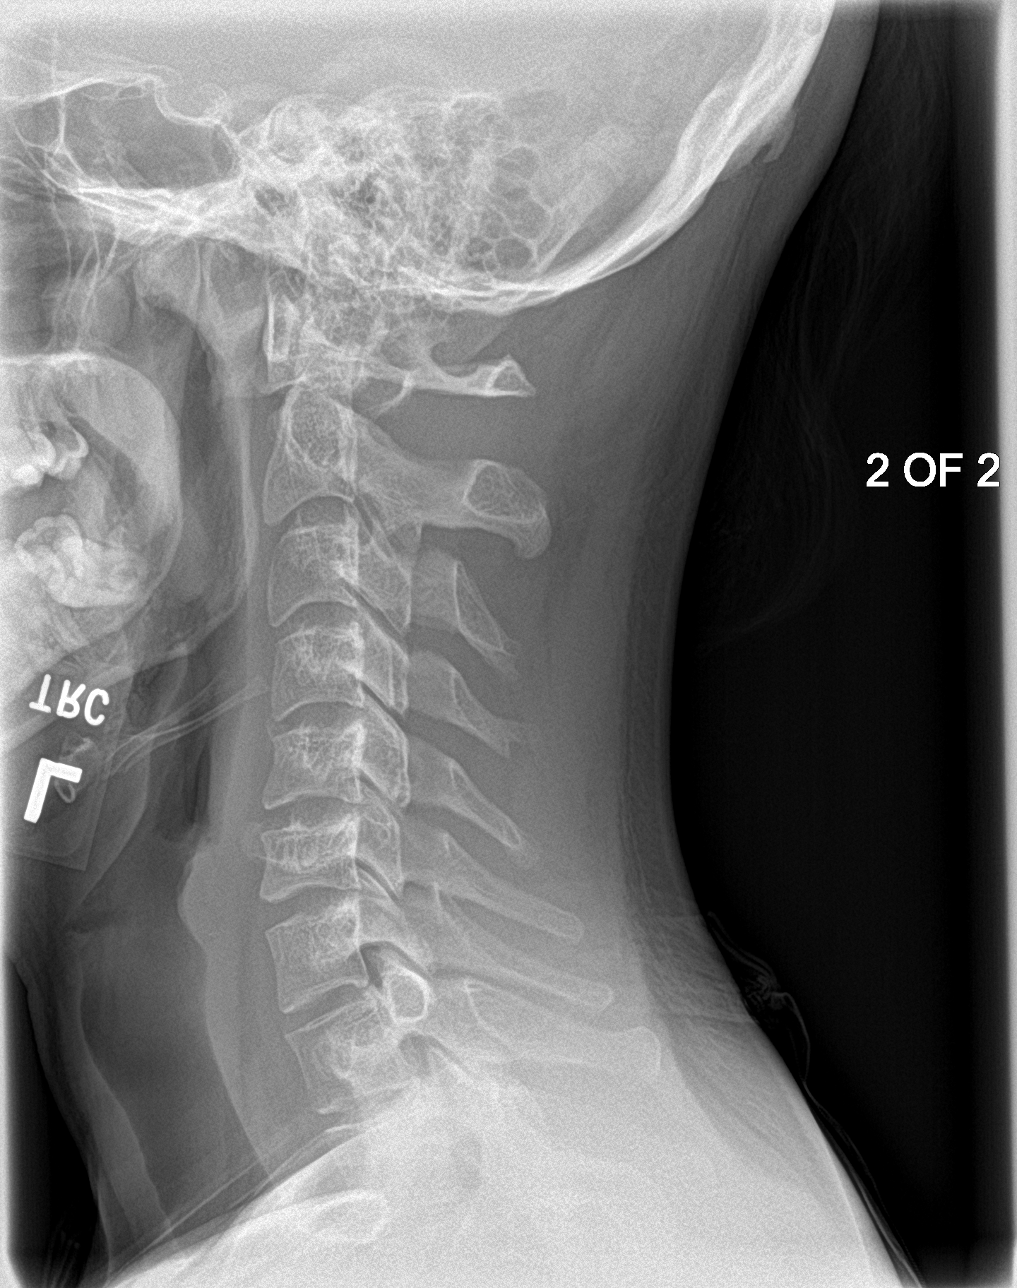
[im 4/4]
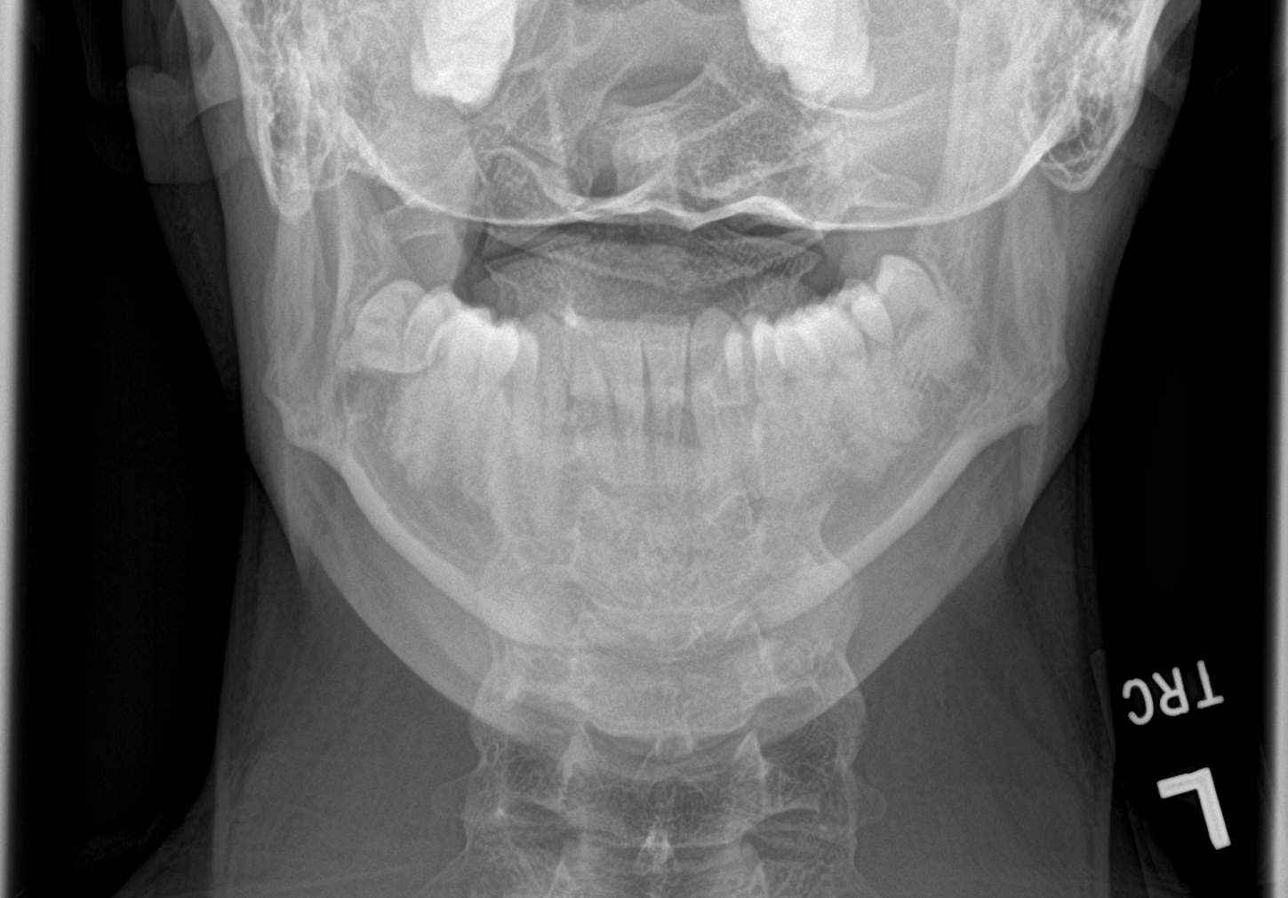

[4 of 4 positions shown; findings below may reference images not displayed]

FINDINGS: Frontal and lateral views of the cervical spine are obtained.
Alignment is anatomic to the cervicothoracic junction. There are no
acute displaced fractures. Disc spaces are well preserved. Soft
tissues are unremarkable.
IMPRESSION: 1. Unremarkable cervical spine.

## 2023-07-23 ENCOUNTER — Other Ambulatory Visit: Payer: Self-pay

## 2023-07-23 ENCOUNTER — Emergency Department

## 2023-07-23 ENCOUNTER — Emergency Department
Admission: EM | Admit: 2023-07-23 | Discharge: 2023-07-23 | Disposition: A | Discharge status: Home / Self Care | Attending: Emergency Medicine | Admitting: Emergency Medicine

## 2023-07-23 DIAGNOSIS — M5441 Lumbago with sciatica, right side: Secondary | ICD-10-CM | POA: Diagnosis not present

## 2023-07-23 DIAGNOSIS — M545 Low back pain, unspecified: Secondary | ICD-10-CM | POA: Diagnosis present

## 2023-07-23 MED ORDER — MELOXICAM 15 MG PO TABS: 15.0000 mg | ORAL_TABLET | Freq: Every day | ORAL | 0 refills | Status: AC

## 2023-07-23 MED ORDER — LIDOCAINE 5 % EX PTCH: 1.0000 | MEDICATED_PATCH | Freq: Two times a day (BID) | CUTANEOUS | 0 refills | Status: AC

## 2023-07-23 MED ORDER — OXYCODONE-ACETAMINOPHEN 5-325 MG PO TABS
1.0000 | ORAL_TABLET | Freq: Once | ORAL | Status: AC
  Administered 2023-07-23: 1 via ORAL
  Filled 2023-07-23: qty 1

## 2023-07-23 MED ORDER — PREDNISONE 10 MG PO TABS: 10.0000 mg | ORAL_TABLET | Freq: Every day | ORAL | 0 refills | Status: AC

## 2023-07-23 MED ORDER — CYCLOBENZAPRINE HCL 10 MG PO TABS
10.0000 mg | ORAL_TABLET | Freq: Once | ORAL | Status: AC
  Administered 2023-07-23: 10 mg via ORAL
  Filled 2023-07-23: qty 1

## 2023-07-23 MED ORDER — CYCLOBENZAPRINE HCL 10 MG PO TABS: 10.0000 mg | ORAL_TABLET | Freq: Three times a day (TID) | ORAL | 0 refills | Status: AC | PRN

## 2023-07-23 MED ORDER — KETOROLAC TROMETHAMINE 60 MG/2ML IM SOLN
60.0000 mg | Freq: Once | INTRAMUSCULAR | Status: AC
  Administered 2023-07-23: 60 mg via INTRAMUSCULAR
  Filled 2023-07-23: qty 2

## 2023-07-23 MED ORDER — LIDOCAINE 5 % EX PTCH
1.0000 | MEDICATED_PATCH | CUTANEOUS | Status: DC
  Administered 2023-07-23: 1 via TRANSDERMAL
  Filled 2023-07-23: qty 1

## 2023-07-23 MED ORDER — DEXAMETHASONE SODIUM PHOSPHATE 10 MG/ML IJ SOLN
10.0000 mg | Freq: Once | INTRAMUSCULAR | Status: AC
  Administered 2023-07-23: 10 mg via INTRAMUSCULAR
  Filled 2023-07-23: qty 1

## 2023-07-23 NOTE — ED Provider Notes (Signed)
 ----------------------------------------- 7:04 PM on 07/23/2023 -----------------------------------------  Blood pressure 105/68, pulse 80, temperature 99.2 F (37.3 C), temperature source Oral, resp. rate 18, height 5' 6 (1.676 m), weight 70.3 kg, SpO2 97%.  Assuming care from Dr. Devere Gin, PA-C/NP-C.  In short, Charles Burke is a 28 y.o. male with a chief complaint of Back Pain .  Refer to the original H&P for additional details.  The current plan of care is to have results of x-ray to determine disposition.  ____________________________________________    ED Results / Procedures / Treatments   Labs (all labs ordered are listed, but only abnormal results are displayed) Labs Reviewed - No data to display   EKG     RADIOLOGY  I personally viewed and evaluated these images as part of my medical decision making, as well as reviewing the written report by the radiologist.  ED Provider Interpretation:   DG Lumbar Spine 2-3 Views Result Date: 07/23/2023 CLINICAL DATA:  pain EXAM: LUMBAR SPINE - 2-3 VIEW COMPARISON:  None Available. FINDINGS: Five non rib-bearing lumbar type vertebral bodies. Normal alignment with expected lumbar lordosis. Vertebral body heights are well maintained without acute fracture. Mild intervertebral disc height loss at L5-S1. The soft tissues are otherwise unremarkable. IMPRESSION: 1. No acute fracture or malalignment of the lumbar spine. 2. Mild intervertebral disc height loss at L5-S1. Electronically Signed   By: Rogelia Myers M.D.   On: 07/23/2023 19:38     PROCEDURES:  Critical Care performed: No  Procedures   MEDICATIONS ORDERED IN ED: Medications  ketorolac  (TORADOL ) injection 60 mg (has no administration in time range)  lidocaine  (LIDODERM ) 5 % 1 patch (has no administration in time range)  dexamethasone  (DECADRON ) injection 10 mg (10 mg Intramuscular Given 07/23/23 1836)  oxyCODONE -acetaminophen  (PERCOCET/ROXICET) 5-325 MG per  tablet 1 tablet (1 tablet Oral Given 07/23/23 1836)  cyclobenzaprine  (FLEXERIL ) tablet 10 mg (10 mg Oral Given 07/23/23 1836)     IMPRESSION / MDM / ASSESSMENT AND PLAN / ED COURSE  I reviewed the triage vital signs and the nursing notes.                              Differential diagnosis includes, but is not limited to, fracture, muscle strain, spondylolithiasis, spondylolysis.  Patient's presentation is most consistent with acute complicated illness / injury requiring diagnostic workup.    Patient's diagnosis is consistent with lumbar muscle strain. Patient will be discharged home with prescriptions for prednisone , Flexeril , meloxicam , lidocaine  patch. Patient is to follow up with orthopedics as needed or otherwise directed. Patient is given ED precautions to return to the ED for any worsening or new symptoms.  Patient is here with his mother and the agree with plan.  Patient verbalized understanding.  Clinical Course as of 07/23/23 2019  Mon Jul 23, 2023  2005 DG Lumbar Spine 2-3 Views  No acute fracture or malalignment of the lumbar spine. 2. Mild intervertebral disc height loss at L5-S1.   [AE]  2012 Reassessed the patient, states pain is the same.  I will order Toradol , lidocaine  patch.  Updated patient about x-ray.  Patient is going to be discharged with prednisone , meloxicam , Flexeril , lidocaine  patch.  Patient will have a follow-up appointment with orthopedics.  Patient agreeable with the plan [AE]    Clinical Course User Index [AE] Janit Kast, PA-C    FINAL CLINICAL IMPRESSION(S) / ED DIAGNOSES   Final diagnoses:  Acute midline low back  pain with right-sided sciatica     Rx / DC Orders   ED Discharge Orders          Ordered    cyclobenzaprine  (FLEXERIL ) 10 MG tablet  3 times daily PRN        07/23/23 2015    predniSONE  (DELTASONE ) 10 MG tablet  Daily        07/23/23 2015    lidocaine  (LIDODERM ) 5 %  Every 12 hours        07/23/23 2015    meloxicam   (MOBIC ) 15 MG tablet  Daily        07/23/23 2015             Note:  This document was prepared using Dragon voice recognition software and may include unintentional dictation errors.    Janit Kast, PA-C 07/23/23 2019    Malvina Alm DASEN, MD 07/23/23 2210

## 2023-07-23 NOTE — ED Triage Notes (Signed)
 Pt sts that he is having lower back pain and it travels to the right leg. Pt that it got worse after he sneezed this AM.

## 2023-07-23 NOTE — ED Provider Notes (Signed)
 [facility] Provider Note    Event Date/Time   First MD Initiated Contact with Patient 07/23/23 1812     (approximate)   History   Back Pain   HPI  Charles Burke is a 28 y.o. male tree of bipolar, ADHD, anxiety presents emergency department complaining of low back pain that radiates to the right leg.  Patient states he cannot hardly walk due to the pain.  No loss of bowel or bladder control.  [place]  [place] stuff and thinks that may have caused the back pain.  Denies numbness/tingling.      Physical Exam   Triage Vital Signs: ED Triage Vitals [07/23/23 1654]  Encounter Vitals Group     BP 105/68     Girls Systolic BP Percentile      Girls Diastolic BP Percentile      Boys Systolic BP Percentile      Boys Diastolic BP Percentile      Pulse Rate 80     Resp 18     Temp 99.2 F (37.3 C)     Temp Source Oral     SpO2 97 %     Weight 155 lb (70.3 kg)     Height 5' 6 (1.676 m)     Head Circumference      Peak Flow      Pain Score 7     Pain Loc      Pain Education      Exclude from Growth Chart     Most recent vital signs: Vitals:   07/23/23 1654  BP: 105/68  Pulse: 80  Resp: 18  Temp: 99.2 F (37.3 C)  SpO2: 97%     General: Awake, no distress.   CV:  Good peripheral perfusion Resp:  Normal effort.  Abd:  No distention.   Other:  Lumbar spine tender to palpation, 5/5 strength lower extremities, patient does have pain reproduced with movement, is having difficulty getting out of the wheelchair and onto the stretcher.   ED Results / Procedures / Treatments   Labs (all labs ordered are listed, but only abnormal results are displayed) Labs Reviewed - No data to display   EKG     RADIOLOGY X-ray lumbar spine    PROCEDURES:   Procedures  Critical Care:  no Chief Complaint  Patient presents with   Back Pain      MEDICATIONS ORDERED IN ED: Medications   dexamethasone  (DECADRON ) injection 10 mg (10 mg Intramuscular Given 07/23/23 1836)  oxyCODONE -acetaminophen  (PERCOCET/ROXICET) 5-325 MG per tablet 1 tablet (1 tablet Oral Given 07/23/23 1836)  cyclobenzaprine  (FLEXERIL ) tablet 10 mg (10 mg Oral Given 07/23/23 1836)     IMPRESSION / MDM / ASSESSMENT AND PLAN / ED COURSE  I reviewed the triage vital signs and the nursing notes.                              Differential diagnosis includes, but is not limited to, lumbar strain, lumbar radiculopathy, bulging disc, fracture  Patient's presentation is most consistent with acute illness / injury with system symptoms.   Patient was given Decadron  10 mg IM, Flexeril  10 mg p.o., and Percocet p.o.  X-ray lumbar spine  Care transferred to Charles Sar, PA-C at shift change.  Plan at this time is to assess x-ray, possibly send in medication for back pain including Sterapred  and a muscle relaxer.  Would not recommend narcotics at this time as patient has a history of substance abuse.      FINAL CLINICAL IMPRESSION(S) / ED DIAGNOSES   Final diagnoses:  Acute midline low back pain with right-sided sciatica     Rx / DC Orders   ED Discharge Orders     None        Note:  This document was prepared using Dragon voice recognition software and may include unintentional dictation errors.    Gasper Devere ORN, PA-C 07/23/23 TERRY    Malvina Alm DASEN, MD 07/23/23 2209

## 2023-07-23 NOTE — Discharge Instructions (Addendum)
 You have been diagnosed with acute midline low back pain.  Please take Flexeril  1 tablet by mouth every 8 hours as needed for pain.  Please do not drive while taking this medication.  Please apply 1 lidocaine  patch in your lumbar area every 12 hours.  Please take meloxicam  1 tablet after breakfast every day.  Please take prednisone  1 tablet with breakfast.  Please call Dr. Lorelle and make an appointment for a follow-up.  Please come back to ED or go to your PCP if you have new symptoms or symptoms worsen.

## 2023-09-26 ENCOUNTER — Encounter: Payer: Self-pay | Admitting: *Deleted

## 2023-09-26 ENCOUNTER — Other Ambulatory Visit: Payer: Self-pay

## 2023-09-26 ENCOUNTER — Emergency Department
Admission: EM | Admit: 2023-09-26 | Discharge: 2023-09-26 | Disposition: A | Discharge status: Home / Self Care | Attending: Emergency Medicine | Admitting: Emergency Medicine

## 2023-09-26 DIAGNOSIS — F1410 Cocaine abuse, uncomplicated: Secondary | ICD-10-CM | POA: Diagnosis present

## 2023-09-26 NOTE — ED Provider Notes (Signed)
 [name] Provider Note    Event Date/Time   First MD Initiated Contact with Patient 09/26/23 2218     (approximate)   History   Addiction Problem   HPI  Charles Burke is a 28 y.o. male reports no medical history takes no medications no allergies  For about 10 years he has struggled with cocaine use.  He smokes it.  He last used yesterday.  He has been talking with a friend from his church who had success with a program in the [place] area a long-term program.  He contacted them and they told him that he should have an evaluation prior to enrollment.  He does not want to harm self or anyone else.  He last used cocaine yesterday.  He reports he does not get any withdrawal symptoms but he has a history of chronic use he was tried for 5 times over the last 10 years to stop use.  He was encouraged by a friend who had success to contact the program that he wishes to go to  He does report he last used cocaine yesterday, and he notices drug screen would be positive for 3 or 5 days.  Does not wish to provide a drug screen today   He does not use alcohol.  Uses no other substances except for cocaine at this time history of cannabis use in the past  No tremors no headaches no nausea or vomiting no illness.  Feels perfectly fine.  Last use yesterday.  Friend drove him here  He would like a note or letter stating that he was evaluated   No chest pain no trouble breathing  Physical Exam   Triage Vital Signs: ED Triage Vitals  Encounter Vitals Group     BP 09/26/23 2105 122/75     Girls Systolic BP Percentile --      Girls Diastolic BP Percentile --      Boys Systolic BP Percentile --      Boys Diastolic BP Percentile --      Pulse Rate 09/26/23 2105 87     Resp 09/26/23 2105 18     Temp 09/26/23 2105 98.2 F (36.8 C)     Temp Source 09/26/23 2105 Oral     SpO2 09/26/23 2105 100 %     Weight 09/26/23 2103 145 lb (65.8 kg)     Height 09/26/23 2103  5' 7 (1.702 m)     Head Circumference --      Peak Flow --      Pain Score 09/26/23 2103 0     Pain Loc --      Pain Education --      Exclude from Growth Chart --     Most recent vital signs: Vitals:   09/26/23 2105  BP: 122/75  Pulse: 87  Resp: 18  Temp: 98.2 F (36.8 C)  SpO2: 100%     General: Awake, no distress.  CV:  Good peripheral perfusion.  Normal tones and rate Resp:  Normal effort.  Clear bilateral Abd:  No distention.  Soft nontender numbness Other:  Ambulates with normal gait very pleasant well-oriented.  Speech is clear thought process appear normal   ED Results / Procedures / Treatments   Labs (all labs ordered are listed, but only abnormal results are displayed) Labs Reviewed - No data to display   EKG  And inter by me at 2110 heart rate 80 QRS 80 QTc 400.  Normal sinus  rhythm no evidence of acute ischemia.  Probable mild early repolarization abnormality   RADIOLOGY     PROCEDURES:  Critical Care performed: No  Procedures   MEDICATIONS ORDERED IN ED: Medications - No data to display   IMPRESSION / MDM / ASSESSMENT AND PLAN / ED COURSE  I reviewed the triage vital signs and the nursing notes.                              Differential diagnosis includes, but is not limited to, substance abuse.  Discussed with patient offered TTS evaluation and communication with the program he would like to enroll in to see if there are more specifics lab test drug screen etc. that they might require.   Patient does not wish for consultation with TTS team at this time.  Rather he reports that he believes that he can just get a simple note stating that he was evaluated and will reach out to the program tomorrow if there is further testing or labs needed.  Encouraged him also to follow-up with [facility] gave him information on that discussed  He does not have any evidence of acute medical condition withdrawal or psychiatric concern at this time.  Longstanding  history of cocaine use now seeking treatment when she is actively engaging in  Patient's presentation is most consistent with acute, uncomplicated illness.     Return precautions and treatment recommendations and follow-up discussed with the patient who is agreeable with the plan.      FINAL CLINICAL IMPRESSION(S) / ED DIAGNOSES   Final diagnoses:  Cocaine abuse (HCC)     Rx / DC Orders   ED Discharge Orders     None        Note:  This document was prepared using Dragon voice recognition software and may include unintentional dictation errors.   Dicky Anes, MD 09/26/23 2237

## 2023-09-26 NOTE — ED Triage Notes (Signed)
 Pt ambulatory to triage.  Pt is requesting detox from cocaine.  Last used cocaine yesterday.  Pt denies SI or HI.  Pt denies etoh use.  Pt calm and cooperative.

## 2023-09-26 NOTE — Discharge Instructions (Addendum)
  Follow up with your doctor, [facility] (or other substance abuse program) and/or therapist as soon as possible regarding today's ED visit.   Please follow up any other recommendations and clinic appointments provided by the psychiatry team that saw you in the Emergency Department.  Recommend no further use of cocaine.  No driving today or while under the influence of potential substances such as cocaine.
